# Patient Record
Sex: Female | Born: 1985 | Race: Black or African American | Hispanic: No | Marital: Single | Smoking: Never smoker
Health system: Southern US, Community
[De-identification: ages and names within clinical notes are randomized; demographics above are authoritative.]

## PROBLEM LIST (undated history)

## (undated) DIAGNOSIS — J302 Other seasonal allergic rhinitis: Secondary | ICD-10-CM

---
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
## 2008-01-11 ENCOUNTER — Emergency Department (HOSPITAL_COMMUNITY): Admission: EM | Admit: 2008-01-11 | Discharge: 2008-01-11 | Payer: Self-pay | Admitting: Emergency Medicine

## 2008-09-10 ENCOUNTER — Other Ambulatory Visit: Admission: RE | Admit: 2008-09-10 | Discharge: 2008-09-10 | Payer: Self-pay | Admitting: Obstetrics and Gynecology

## 2009-08-04 ENCOUNTER — Emergency Department (HOSPITAL_COMMUNITY): Admission: EM | Admit: 2009-08-04 | Discharge: 2009-08-04 | Payer: Self-pay | Admitting: Emergency Medicine

## 2009-09-11 ENCOUNTER — Emergency Department (HOSPITAL_COMMUNITY): Admission: EM | Admit: 2009-09-11 | Discharge: 2009-09-11 | Payer: Self-pay | Admitting: Emergency Medicine

## 2010-02-15 ENCOUNTER — Emergency Department (HOSPITAL_COMMUNITY): Admission: EM | Admit: 2010-02-15 | Discharge: 2010-02-15 | Payer: Self-pay | Admitting: Emergency Medicine

## 2010-06-19 ENCOUNTER — Emergency Department (HOSPITAL_COMMUNITY)
Admission: EM | Admit: 2010-06-19 | Discharge: 2010-06-19 | Payer: Self-pay | Source: Home / Self Care | Admitting: Emergency Medicine

## 2011-02-25 ENCOUNTER — Emergency Department (HOSPITAL_COMMUNITY)
Admission: EM | Admit: 2011-02-25 | Discharge: 2011-02-25 | Disposition: A | Discharge status: Home / Self Care | Payer: BC Managed Care – PPO | Attending: Emergency Medicine | Admitting: Emergency Medicine

## 2011-02-25 ENCOUNTER — Emergency Department (HOSPITAL_COMMUNITY): Payer: BC Managed Care – PPO

## 2011-02-25 DIAGNOSIS — M7650 Patellar tendinitis, unspecified knee: Secondary | ICD-10-CM

## 2011-02-25 DIAGNOSIS — M25569 Pain in unspecified knee: Secondary | ICD-10-CM | POA: Insufficient documentation

## 2011-02-25 HISTORY — DX: Other seasonal allergic rhinitis: J30.2

## 2011-02-25 MED ORDER — IBUPROFEN 800 MG PO TABS: 800.0000 mg | ORAL_TABLET | Freq: Three times a day (TID) | ORAL | Status: AC

## 2011-02-25 NOTE — ED Notes (Signed)
Pt requested pregnancy test after triage, told to discuss w. edp and bedside rn

## 2011-02-25 NOTE — ED Provider Notes (Signed)
History     CSN: [id number] Arrival date & time: 02/25/2011  4:29 AM  Chief Complaint  Patient presents with  . Knee Pain   HPI Comments: Patient was playing basketball and she injured her knee. She's been still having trouble whenever she exercises. It feels stiff at times. No known swelling no fevers.  Patient is a 25 y.o. female presenting with knee pain. The history is provided by the patient.  Knee Pain Episode onset: One month ago. The problem occurs daily. The problem has not changed since onset.The symptoms are aggravated by twisting (Exercise). The symptoms are relieved by rest.    Past Medical History  Diagnosis Date  . Seasonal allergies     History reviewed. No pertinent past surgical history.  No family history on file.  History  Substance Use Topics  . Smoking status: Never Smoker   . Smokeless tobacco: Not on file  . Alcohol Use: Yes     occ    OB History    Grav Para Term Preterm Abortions TAB SAB Ect Mult Living                  Review of Systems  All other systems reviewed and are negative.    Physical Exam  BP 126/96  Pulse 57  Temp(Src) 97.8 F (36.6 C) (Oral)  Resp 18  Ht 5\' 5"  (1.651 m)  Wt 149 lb 4 oz (67.699 kg)  BMI 24.84 kg/m2  SpO2 100%  LMP 02/11/2011  Physical Exam  Constitutional: She appears well-developed and well-nourished. No distress.  HENT:  Head: Normocephalic and atraumatic.  Right Ear: External ear normal.  Left Ear: External ear normal.  Eyes: Conjunctivae are normal. Right eye exhibits no discharge. Left eye exhibits no discharge. No scleral icterus.  Neck: Neck supple. No tracheal deviation present.  Pulmonary/Chest: Effort normal. No stridor. No respiratory distress.  Musculoskeletal: Normal range of motion. She exhibits no edema and no tenderness.       Right knee: She exhibits no swelling, no effusion, no deformity and no bony tenderness. no tenderness found. No medial joint line, no lateral joint line, no  MCL and no LCL tenderness noted.  Neurological: She is alert. Cranial nerve deficit: no gross deficits.  Skin: Skin is warm and dry. No rash noted.  Psychiatric: She has a normal mood and affect.    ED Course  Procedures No information on file. Patient requested urine pregnancy tests an x-ray of her knee   Labs Reviewed  PREGNANCY, URINE  POCT PREGNANCY, URINE  Dg Knee Complete 4 Views Right  02/25/2011  *RADIOLOGY REPORT*  Clinical Data: Continued pain after fall 1 month ago.  RIGHT KNEE - COMPLETE 4+ VIEW  Comparison: None.  Findings: No evidence of acute fracture or subluxation.  No focal bone lesions.  Bone matrix and cortex appear intact.  No abnormal radiopaque densities in the soft tissues.  No significant effusion.  IMPRESSION: No acute bony abnormality demonstrated in the right knee.  Original Report Authenticated By: Marlon Pel, M.D.    MDM Patient with mild knee pain with exercise. May be related to tendinitis. We'll have her try a course of NSAIDs followup with orthopedist routinely.     Celene Kras, MD 02/25/11 [phone]

## 2011-02-25 NOTE — ED Notes (Signed)
Pt injured right knee one month ago and wants to get it checked out tonight

## 2012-06-12 ENCOUNTER — Encounter (HOSPITAL_COMMUNITY): Payer: Self-pay | Admitting: *Deleted

## 2012-06-12 ENCOUNTER — Emergency Department (HOSPITAL_COMMUNITY): Payer: Self-pay

## 2012-06-12 ENCOUNTER — Emergency Department (HOSPITAL_COMMUNITY)
Admission: EM | Admit: 2012-06-12 | Discharge: 2012-06-12 | Disposition: A | Discharge status: Home / Self Care | Payer: Self-pay | Attending: Emergency Medicine | Admitting: Emergency Medicine

## 2012-06-12 DIAGNOSIS — R059 Cough, unspecified: Secondary | ICD-10-CM | POA: Insufficient documentation

## 2012-06-12 DIAGNOSIS — R05 Cough: Secondary | ICD-10-CM | POA: Insufficient documentation

## 2012-06-12 DIAGNOSIS — R110 Nausea: Secondary | ICD-10-CM | POA: Insufficient documentation

## 2012-06-12 DIAGNOSIS — R509 Fever, unspecified: Secondary | ICD-10-CM | POA: Insufficient documentation

## 2012-06-12 DIAGNOSIS — R52 Pain, unspecified: Secondary | ICD-10-CM | POA: Insufficient documentation

## 2012-06-12 DIAGNOSIS — R062 Wheezing: Secondary | ICD-10-CM | POA: Insufficient documentation

## 2012-06-12 DIAGNOSIS — R6889 Other general symptoms and signs: Secondary | ICD-10-CM

## 2012-06-12 MED ORDER — ALBUTEROL SULFATE HFA 108 (90 BASE) MCG/ACT IN AERS: 2.0000 | INHALATION_SPRAY | Freq: Once | RESPIRATORY_TRACT | Status: DC

## 2012-06-12 MED ORDER — ALBUTEROL SULFATE HFA 108 (90 BASE) MCG/ACT IN AERS
2.0000 | INHALATION_SPRAY | Freq: Once | RESPIRATORY_TRACT | Status: AC
  Administered 2012-06-12: 2 via RESPIRATORY_TRACT
  Filled 2012-06-12: qty 6.7

## 2012-06-12 MED ORDER — PROMETHAZINE HCL 25 MG PO TABS: 25.0000 mg | ORAL_TABLET | Freq: Four times a day (QID) | ORAL | Status: DC | PRN

## 2012-06-12 MED ORDER — ACETAMINOPHEN 500 MG PO TABS
ORAL_TABLET | ORAL | Status: AC
  Administered 2012-06-12: 1000 mg
  Filled 2012-06-12: qty 2

## 2012-06-12 NOTE — ED Notes (Signed)
Cough, body aches, chills,nausea.  Fever.

## 2012-06-14 NOTE — ED Provider Notes (Signed)
History     CSN: [id number]  Arrival date & time 06/12/12  1621   First MD Initiated Contact with Patient 06/12/12 1835      Chief Complaint  Patient presents with  . Cough    (Consider location/radiation/quality/duration/timing/severity/associated sxs/prior treatment) HPI Comments: Madeline Sanders presents with rather acute onset of non productive cough,  Generalized body aches with chills, fever and vague nausea without emesis, her symptoms started this morning shortly after waking.   She reports wheezing prior to arrival which has since improved.  She does not have asthma.  She denies shortness of breath, chest pain, sore throat, congestion, diarrhea or abdominal pain.  She has taken no medications prior to arrival.  The history is provided by the patient.    Past Medical History  Diagnosis Date  . Seasonal allergies     History reviewed. No pertinent past surgical history.  History reviewed. No pertinent family history.  History  Substance Use Topics  . Smoking status: Never Smoker   . Smokeless tobacco: Not on file  . Alcohol Use: Yes     Comment: occ    OB History    Grav Para Term Preterm Abortions TAB SAB Ect Mult Living                  Review of Systems  Allergies  Erythromycin  Home Medications   Current Outpatient Rx  Name  Route  Sig  Dispense  Refill  . IBUPROFEN 200 MG PO TABS   Oral   Take 800 mg by mouth once as needed. For pain         . ADULT MULTIVITAMIN W/MINERALS CH   Oral   Take 1 tablet by mouth daily.         Marland Kitchen PROMETHAZINE HCL 25 MG PO TABS   Oral   Take 1 tablet (25 mg total) by mouth every 6 (six) hours as needed for nausea.   15 tablet   0     BP 118/77  Pulse 100  Temp 102.3 F (39.1 C) (Oral)  Resp 20  Ht 5\' 5"  (1.651 m)  Wt 148 lb (67.132 kg)  BMI 24.63 kg/m2  SpO2 99%  LMP 06/05/2012  Physical Exam  Constitutional: She is oriented to person, place, and time. She appears well-developed and  well-nourished.       Febrile.  HENT:  Head: Normocephalic and atraumatic.  Right Ear: Tympanic membrane and ear canal normal.  Left Ear: Tympanic membrane and ear canal normal.  Nose: Mucosal edema and rhinorrhea present.  Mouth/Throat: Uvula is midline, oropharynx is clear and moist and mucous membranes are normal. No oropharyngeal exudate, posterior oropharyngeal edema, posterior oropharyngeal erythema or tonsillar abscesses.  Eyes: Conjunctivae normal are normal.  Neck: Neck supple.  Cardiovascular: Normal rate and normal heart sounds.   Pulmonary/Chest: Effort normal. No respiratory distress. She has no wheezes. She has no rales.  Abdominal: Soft. There is no tenderness. There is no guarding.  Musculoskeletal: Normal range of motion.  Lymphadenopathy:    She has no cervical adenopathy.  Neurological: She is alert and oriented to person, place, and time.  Skin: Skin is warm and dry. No rash noted.  Psychiatric: She has a normal mood and affect.    ED Course  Procedures (including critical care time)  Labs Reviewed - No data to display Dg Chest 2 View  06/12/2012  *RADIOLOGY REPORT*  Clinical Data: Cough  CHEST - 2 VIEW  Comparison: None.  Findings: Cardiomediastinal silhouette is unremarkable.  No acute infiltrate or pleural effusion.  No pulmonary edema.  Bony thorax is unremarkable.  IMPRESSION: .  No active disease.   Original Report Authenticated By: Natasha Mead, M.D.      1. Flu-like symptoms    Patient was given tylenol upon arrival.  She reported feeling much better at time of dc.  Temp not rechecked at time of dc.     MDM  Flu like illness.  Pt given albuterol mdi prn cough and wheeze,  Although no active wheeze or respiratory distress during ed visit.  Discussed tamiflu - pt deferred.  Encouraged rest,  Increased fluids.  Tylenol or motrin for fever reduction.  Recheck prn worsened sx  Patients labs and/or radiological studies were reviewed during the medical decision  making and disposition process.         Burgess Amor, PA 06/14/12 1336

## 2012-06-16 NOTE — ED Provider Notes (Signed)
Medical screening examination/treatment/procedure(s) were performed by non-physician practitioner and as supervising physician I was immediately available for consultation/collaboration.  Massai Hankerson, MD 06/16/12 0614 

## 2013-07-02 ENCOUNTER — Other Ambulatory Visit: Payer: Self-pay | Admitting: Obstetrics and Gynecology

## 2013-09-28 ENCOUNTER — Encounter: Payer: Self-pay | Admitting: *Deleted

## 2013-11-26 ENCOUNTER — Encounter: Payer: Self-pay | Admitting: Obstetrics and Gynecology

## 2015-06-16 ENCOUNTER — Emergency Department (HOSPITAL_COMMUNITY)
Admission: EM | Admit: 2015-06-16 | Discharge: 2015-06-16 | Disposition: A | Discharge status: Home / Self Care | Payer: 59 | Attending: Emergency Medicine | Admitting: Emergency Medicine

## 2015-06-16 ENCOUNTER — Encounter (HOSPITAL_COMMUNITY): Payer: Self-pay | Admitting: Emergency Medicine

## 2015-06-16 DIAGNOSIS — Z79899 Other long term (current) drug therapy: Secondary | ICD-10-CM | POA: Insufficient documentation

## 2015-06-16 DIAGNOSIS — J029 Acute pharyngitis, unspecified: Secondary | ICD-10-CM | POA: Diagnosis not present

## 2015-06-16 DIAGNOSIS — J392 Other diseases of pharynx: Secondary | ICD-10-CM

## 2015-06-16 LAB — RAPID STREP SCREEN (MED CTR MEBANE ONLY): Streptococcus, Group A Screen (Direct): NEGATIVE

## 2015-06-16 NOTE — ED Notes (Signed)
Pt states for last 2 weeks been having bad sore throat especially at night

## 2015-06-16 NOTE — Discharge Instructions (Signed)
Stop the benadryl, it will keep your throat dry. Do turn on the vaporizer about an hour before bedtime to get the moisture in the air in your bedroom (keep the door shut). Try to drink fluids prior to bedtime and keep water at your bedside to drink when ever you wake up during the night. Recheck if you get a fever or are unable to swallow or seem worse.

## 2015-06-16 NOTE — ED Provider Notes (Signed)
CSN: [id number]     Arrival date & time 06/16/15  0609 History   First MD Initiated Contact with Patient 06/16/15 [phone]   Chief Complaint  Patient presents with  . Sore Throat     (Consider location/radiation/quality/duration/timing/severity/associated sxs/prior Treatment) HPI patient states about 2 weeks ago she had nasal congestion that was yellow and green. She was using a vicks vaporizer. She states her symptoms are now gone. She states she was taking Benadryl and last took it yesterday. She states about 2 AM in the morning she started noticing she had a very painful sore throat when she swallowed. She states her mouth feels dry. This started about a week ago. She states over the weekend the past 2-3 days it seemed to get better especially yesterday when it was raining. She denies any sneezing, coughing, or fever. She states she's able to eat and drink during the day and her throat doesn't hurt much at all, only at night. 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   PCP Dr Hughie ClossZ Hall  Past Medical History  Diagnosis Date  . Seasonal allergies    History reviewed. No pertinent past surgical history. History reviewed. No pertinent family history. Social History  Substance Use Topics  . Smoking status: Never Smoker   . Smokeless tobacco: None  . 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     Allergies  Erythromycin  Home Medications   Prior to Admission medications   Medication Sig Start Date End Date Taking? Authorizing Provider  ibuprofen (ADVIL,MOTRIN) 200 MG tablet Take 800 mg by mouth once as needed. For pain    Historical Provider, MD  Multiple Vitamin (MULTIVITAMIN WITH MINERALS) TABS Take 1 tablet by mouth daily.    Historical Provider, MD  promethazine  (PHENERGAN) 25 MG tablet Take 1 tablet (25 mg total) by mouth every 6 (six) hours as needed for nausea. 06/12/12   Burgess AmorJulie Idol, PA-C   BP 130/94 mmHg  Pulse 62  Temp(Src) 97.8 F (36.6 C) (Oral)  Resp 18  Ht 5\' 5"  (1.651 m)  Wt 173 lb (78.472 kg)  BMI 28.79 kg/m2  SpO2 100%  LMP 06/12/2015  Vital signs normal   Physical Exam  Constitutional: She is oriented to person, place, and time. She appears well-developed and well-nourished.  Non-toxic appearance. She does not appear ill. No distress.  HENT:  Head: Normocephalic and atraumatic.  Right Ear: Hearing, tympanic membrane, external ear and ear canal normal.  Left Ear: Hearing, tympanic membrane, external ear and ear canal normal.  Nose: Nose normal. No mucosal edema or rhinorrhea.  Mouth/Throat: Oropharynx is clear and moist and mucous membranes are normal. No dental abscesses or uvula swelling.  Eyes: Conjunctivae and EOM are normal. Pupils are equal, round, and reactive to light.  Neck: Normal range of motion and full passive range of motion without pain. Neck supple.  Cardiovascular: Normal rate, regular rhythm and normal heart sounds.  Exam reveals no gallop and no friction rub.   No murmur heard. Pulmonary/Chest: Effort normal and breath sounds normal. No respiratory distress. She has no wheezes. She has no rhonchi. She has no rales. She exhibits no tenderness and no crepitus.  Abdominal: Soft. Normal appearance and  bowel sounds are normal. She exhibits no distension. There is no tenderness. There is no rebound and no guarding.  Musculoskeletal: Normal range of motion. She exhibits no edema or tenderness.  Moves all extremities well.   Neurological: She is alert and oriented to person, place, and time. She has normal strength. No cranial nerve deficit.  Skin: Skin is warm, dry and intact. No rash noted. No erythema. No pallor.  Psychiatric: She has a normal mood and affect. Her speech is normal and behavior is normal. Her mood  appears not anxious.  Nursing note and vitals reviewed.   ED Course  Procedures (including critical care time)  I discussed with patient that it sounds like her throat is just getting very dry while she is sleeping at night. She has been taking Benadryl for her nasal congestion which is very drying. Also at night most people have breathing with her mouth open which again dries out the throat. Her throat looks normal however due to her exposure to children a strep screen was done.  We discussed that if strep screen was negative she should drink water before she goes to bed and keep water at her bedside to drink whenever she wakes up during the night. She can use the vaporizer in her room and started about an hour prior to bedroom and shut the door so the moisture stays in her room. And now that her nasal congestion is gone she can stop the Benadryl.  Labs Review Strept pending   MDM   Final diagnoses:  Sore throat  Dry throat    Plan discharge  Devoria Albe, MD, Concha Pyo, MD 06/16/15 [phone]

## 2015-06-18 LAB — CULTURE, GROUP A STREP: STREP A CULTURE: NEGATIVE

## 2015-07-13 ENCOUNTER — Encounter (HOSPITAL_COMMUNITY): Payer: Self-pay | Admitting: Emergency Medicine

## 2015-07-13 ENCOUNTER — Emergency Department (HOSPITAL_COMMUNITY)
Admission: EM | Admit: 2015-07-13 | Discharge: 2015-07-13 | Disposition: A | Discharge status: Home / Self Care | Payer: BLUE CROSS/BLUE SHIELD | Attending: Emergency Medicine | Admitting: Emergency Medicine

## 2015-07-13 DIAGNOSIS — R0981 Nasal congestion: Secondary | ICD-10-CM | POA: Diagnosis present

## 2015-07-13 DIAGNOSIS — J0100 Acute maxillary sinusitis, unspecified: Secondary | ICD-10-CM | POA: Insufficient documentation

## 2015-07-13 MED ORDER — AMOXICILLIN-POT CLAVULANATE 875-125 MG PO TABS: 1.0000 | ORAL_TABLET | Freq: Two times a day (BID) | ORAL | Status: DC

## 2015-07-13 NOTE — ED Provider Notes (Signed)
CSN: [id number]     Arrival date & time 07/13/15  1020 History   First MD Initiated Contact with Patient 07/13/15 1052     Chief Complaint  Patient presents with  . Nasal Congestion     (Consider location/radiation/quality/duration/timing/severity/associated sxs/prior Treatment) Patient is a 30 y.o. female presenting with cough. The history is provided by the patient. No language interpreter was used.  Cough Cough characteristics:  Productive Sputum characteristics:  Nondescript Severity:  Moderate Onset quality:  Gradual Duration:  4 weeks Timing:  Constant Progression:  Worsening Chronicity:  New Smoker: no   Context: upper respiratory infection   Relieved by:  Nothing Worsened by:  Nothing tried Ineffective treatments:  None tried Associated symptoms: sinus congestion   Pt has tried zyrtec, claritin, flonase and a humidifier.  Pt reports no relief.  Pt complains of green sinus congestion  Past Medical History  Diagnosis Date  . Seasonal allergies    History reviewed. No pertinent past surgical history. No family history on file. Social History  Substance Use Topics  . Smoking status: Never Smoker   . Smokeless tobacco: None  . Alcohol Use: Yes     Comment: occ   OB History    No data available     Review of Systems  Respiratory: Positive for cough.   All other systems reviewed and are negative.     Allergies  Erythromycin  Home Medications   Prior to Admission medications   Medication Sig Start Date End Date Taking? Authorizing Provider  amoxicillin-clavulanate (AUGMENTIN) 875-125 MG tablet Take 1 tablet by mouth every 12 (twelve) hours. 07/13/15   Elson AreasLeslie K Sofia, PA-C  diphenhydrAMINE (BENADRYL) 25 mg capsule Take 25 mg by mouth every 6 (six) hours as needed for allergies.    Historical Provider, MD  Pseudoeph-Doxylamine-DM-APAP (NYQUIL PO) Take 2 capsules by mouth daily as needed (cold symptoms).    Historical Provider, MD   BP 130/90 mmHg  Pulse 76   Temp(Src) 97.9 F (36.6 C) (Oral)  Resp 16  Ht 5\' 5"  (1.651 m)  Wt 78.472 kg  BMI 28.79 kg/m2  SpO2 100%  LMP 07/11/2015 Physical Exam  Constitutional: She is oriented to person, place, and time. She appears well-developed and well-nourished.  HENT:  Head: Normocephalic and atraumatic.  Right Ear: External ear normal.  Left Ear: External ear normal.  Tender bilat maxillary sinuses  Eyes: Conjunctivae and EOM are normal. Pupils are equal, round, and reactive to light.  Neck: Normal range of motion.  Cardiovascular: Normal rate.   Pulmonary/Chest: Effort normal.  Abdominal: She exhibits no distension.  Musculoskeletal: Normal range of motion.  Neurological: She is alert and oriented to person, place, and time.  Psychiatric: She has a normal mood and affect.  Nursing note and vitals reviewed.   ED Course  Procedures (including critical care time) Labs Review Labs Reviewed - No data to display  Imaging Review No results found. I have personally reviewed and evaluated these images and lab results as part of my medical decision-making.   EKG Interpretation None      MDM   Final diagnoses:  Acute maxillary sinusitis, recurrence not specified    Meds ordered this encounter  Medications  . amoxicillin-clavulanate (AUGMENTIN) 875-125 MG tablet    Sig: Take 1 tablet by mouth every 12 (twelve) hours.    Dispense:  14 tablet    Refill:  0    Order Specific Question:  Supervising Provider    Answer:  Eber HongMILLER, BRIAN [[id number]]  See Dr. Margo Aye for recheck if symptoms persist   Elson Areas, PA-C 07/13/15 1105  Donnetta Hutching, MD 07/14/15 [phone]

## 2015-07-13 NOTE — Discharge Instructions (Signed)

## 2015-07-13 NOTE — ED Notes (Signed)
Pt reports nasal/chest congestion and cough x 1 month. Pt states she has been taking OTC medications with no relief. Reports coughing up thick, yellow sputum.

## 2015-10-29 ENCOUNTER — Ambulatory Visit: Payer: BLUE CROSS/BLUE SHIELD | Admitting: Nutrition

## 2015-11-12 ENCOUNTER — Telehealth: Payer: Self-pay | Admitting: Nutrition

## 2015-11-12 ENCOUNTER — Ambulatory Visit: Payer: BLUE CROSS/BLUE SHIELD | Admitting: Nutrition

## 2015-11-12 NOTE — Telephone Encounter (Signed)
vm to reschedule missed appt.

## 2018-06-14 ENCOUNTER — Encounter (HOSPITAL_COMMUNITY): Payer: Self-pay | Admitting: Emergency Medicine

## 2018-06-14 ENCOUNTER — Other Ambulatory Visit: Payer: Self-pay

## 2018-06-14 ENCOUNTER — Emergency Department (HOSPITAL_COMMUNITY): Payer: Self-pay

## 2018-06-14 ENCOUNTER — Emergency Department (HOSPITAL_COMMUNITY)
Admission: EM | Admit: 2018-06-14 | Discharge: 2018-06-14 | Disposition: A | Discharge status: Home / Self Care | Payer: Self-pay | Attending: Emergency Medicine | Admitting: Emergency Medicine

## 2018-06-14 DIAGNOSIS — R0600 Dyspnea, unspecified: Secondary | ICD-10-CM | POA: Insufficient documentation

## 2018-06-14 MED ORDER — ALBUTEROL SULFATE (2.5 MG/3ML) 0.083% IN NEBU: 5.0000 mg | INHALATION_SOLUTION | Freq: Once | RESPIRATORY_TRACT | Status: DC

## 2018-06-14 NOTE — ED Triage Notes (Signed)
Patient states she has felt SOB for approx 1 week, states she feels like it may be related to overeating. RR 16 in Triage, O2 100%. Patient thinks it may be more anxiety than SOB. NAD noted in Triage.

## 2018-06-14 NOTE — ED Notes (Signed)
Pt ambulatory to waiting room. Pt verbalized understanding of discharge instructions.   

## 2018-06-14 NOTE — Progress Notes (Signed)
Pt assessed bs clear hr 76 rr 16 spo2 100% room air  No treatment needed pt in no distressed. treatment d/c'd

## 2018-06-14 NOTE — ED Provider Notes (Signed)
[facility] Provider Note   CSN: [id number] Arrival date & time: 06/14/18  1830     History   Chief Complaint Chief Complaint  Patient presents with  . Shortness of Breath    HPI Marylynn PearsonCheyenne C Hemme is a 32 y.o. female.  Patient states that she get short of breath because she ate too fast.  Then she get anxious that she was short of breath he got more short of breath and a little sweaty.  This is all resolved multiple hours prior to my evaluation will wait in the waiting room.  At this time she is asymptomatic.  She has no lower extremity swelling.  No recent fevers or cough.  She has a history of very similar episodes.  States she is been under a lot of stress recently and never really had any anxiety attacks that she knows of.  No other associated symptoms.   Shortness of Breath     Past Medical History:  Diagnosis Date  . Seasonal allergies     There are no active problems to display for this patient.   History reviewed. No pertinent surgical history.   OB History   None      Home Medications    Prior to Admission medications   Medication Sig Start Date End Date Taking? Authorizing Provider  amoxicillin-clavulanate (AUGMENTIN) 875-125 MG tablet Take 1 tablet by mouth every 12 (twelve) hours. 07/13/15   Elson AreasSofia, Leslie K, PA-C  diphenhydrAMINE (BENADRYL) 25 mg capsule Take 25 mg by mouth every 6 (six) hours as needed for allergies.    [provider]  Pseudoeph-Doxylamine-DM-APAP (NYQUIL PO) Take 2 capsules by mouth daily as needed (cold symptoms).    [provider]    Family History Family History  Problem Relation Age of Onset  . Diabetes Mother     Social History Social History   Tobacco Use  . Smoking status: Never Smoker  . Smokeless tobacco: Never Used  Substance Use Topics  . Alcohol use: Yes    Comment: occ  . Drug use: Never     Allergies   Erythromycin   Review of Systems Review of Systems    Respiratory: Positive for shortness of breath.   All other systems reviewed and are negative.    Physical Exam Updated Vital Signs BP (!) 153/85 (BP Location: Right Arm)   Pulse 74   Temp 98 F (36.7 C) (Oral)   Resp 16   Ht 5\' 5"  (1.651 m)   Wt 74.8 kg   LMP 06/11/2018   SpO2 100%   BMI 27.46 kg/m   Physical Exam  Constitutional: She appears well-developed and well-nourished.  HENT:  Head: Normocephalic and atraumatic.  Neck: Normal range of motion.  Cardiovascular: Normal rate and regular rhythm.  Pulmonary/Chest: Breath sounds normal. No stridor. No respiratory distress. She exhibits no mass and no tenderness.  Abdominal: She exhibits no distension.  Musculoskeletal: Normal range of motion.       Right lower leg: Normal.       Left lower leg: Normal.  Neurological: She is alert.  Skin: Skin is warm and dry.  Nursing note and vitals reviewed.    ED Treatments / Results  Labs (all labs ordered are listed, but only abnormal results are displayed) Labs Reviewed - No data to display  EKG EKG Interpretation  Date/Time:  Wednesday June 14 2018 18:45:50 EST Ventricular Rate:  75 PR Interval:  122 QRS Duration: 88 QT Interval:  342  QTC Calculation: 381 R Axis:   62 Text Interpretation:  Normal sinus rhythm with sinus arrhythmia No old tracing to compare Confirmed by Marily Memos [phone]) on 06/14/2018 11:56:25 PM   Radiology Dg Chest 2 View  Result Date: 06/14/2018 CLINICAL DATA:  Shortness of breath EXAM: CHEST - 2 VIEW COMPARISON:  06/12/2012 FINDINGS: Heart and mediastinal contours are within normal limits. No focal opacities or effusions. No acute bony abnormality. IMPRESSION: No active cardiopulmonary disease. Electronically Signed   By: Charlett Nose M.D.   On: 06/14/2018 19:25    Procedures Procedures (including critical care time)  Medications Ordered in ED Medications - No data to display   Initial Impression / Assessment and Plan / ED Course   I have reviewed the triage vital signs and the nursing notes.  Pertinent labs & imaging results that were available during my care of the patient were reviewed by me and considered in my medical decision making (see chart for details).     Suspect benign cause of symptoms exacerbated by some anxiety. Normal VS. Normal XR. Normal ECG. Doubt acs/pe/dissection or other emergent causes for symptoms at this time.   Final Clinical Impressions(s) / ED Diagnoses   Final diagnoses:  Dyspnea, unspecified type    ED Discharge Orders    None       Rozalia Dino, Barbara Cower, MD 06/14/18 2356

## 2018-08-16 ENCOUNTER — Emergency Department (HOSPITAL_COMMUNITY): Payer: Self-pay

## 2018-08-16 ENCOUNTER — Emergency Department (HOSPITAL_COMMUNITY)
Admission: EM | Admit: 2018-08-16 | Discharge: 2018-08-16 | Disposition: A | Discharge status: Home / Self Care | Payer: Self-pay | Attending: Emergency Medicine | Admitting: Emergency Medicine

## 2018-08-16 ENCOUNTER — Other Ambulatory Visit: Payer: Self-pay

## 2018-08-16 ENCOUNTER — Encounter (HOSPITAL_COMMUNITY): Payer: Self-pay

## 2018-08-16 DIAGNOSIS — R251 Tremor, unspecified: Secondary | ICD-10-CM | POA: Insufficient documentation

## 2018-08-16 DIAGNOSIS — R079 Chest pain, unspecified: Secondary | ICD-10-CM | POA: Insufficient documentation

## 2018-08-16 LAB — URINALYSIS, ROUTINE W REFLEX MICROSCOPIC
Bilirubin Urine: NEGATIVE
GLUCOSE, UA: NEGATIVE mg/dL
Hgb urine dipstick: NEGATIVE
Ketones, ur: NEGATIVE mg/dL
LEUKOCYTES UA: NEGATIVE
Nitrite: NEGATIVE
PROTEIN: NEGATIVE mg/dL
Specific Gravity, Urine: 1.005 (ref 1.005–1.030)
pH: 8 (ref 5.0–8.0)

## 2018-08-16 LAB — CBG MONITORING, ED: GLUCOSE-CAPILLARY: 73 mg/dL (ref 70–99)

## 2018-08-16 LAB — CBC WITH DIFFERENTIAL/PLATELET
ABS IMMATURE GRANULOCYTES: 0.01 10*3/uL (ref 0.00–0.07)
BASOS PCT: 1 %
Basophils Absolute: 0 10*3/uL (ref 0.0–0.1)
Eosinophils Absolute: 0.1 10*3/uL (ref 0.0–0.5)
Eosinophils Relative: 1 %
HEMATOCRIT: 44.7 % (ref 36.0–46.0)
HEMOGLOBIN: 14.1 g/dL (ref 12.0–15.0)
Immature Granulocytes: 0 %
LYMPHS PCT: 37 %
Lymphs Abs: 1.6 10*3/uL (ref 0.7–4.0)
MCH: 30 pg (ref 26.0–34.0)
MCHC: 31.5 g/dL (ref 30.0–36.0)
MCV: 95.1 fL (ref 80.0–100.0)
MONOS PCT: 10 %
Monocytes Absolute: 0.4 10*3/uL (ref 0.1–1.0)
NEUTROS ABS: 2.1 10*3/uL (ref 1.7–7.7)
Neutrophils Relative %: 51 %
Platelets: 191 10*3/uL (ref 150–400)
RBC: 4.7 MIL/uL (ref 3.87–5.11)
RDW: 12.7 % (ref 11.5–15.5)
WBC: 4.2 10*3/uL (ref 4.0–10.5)
nRBC: 0 % (ref 0.0–0.2)

## 2018-08-16 LAB — TROPONIN I

## 2018-08-16 LAB — COMPREHENSIVE METABOLIC PANEL
ALT: 16 U/L (ref 0–44)
AST: 21 U/L (ref 15–41)
Albumin: 4.4 g/dL (ref 3.5–5.0)
Alkaline Phosphatase: 43 U/L (ref 38–126)
Anion gap: 10 (ref 5–15)
BUN: 14 mg/dL (ref 6–20)
CHLORIDE: 103 mmol/L (ref 98–111)
CO2: 26 mmol/L (ref 22–32)
Calcium: 9.8 mg/dL (ref 8.9–10.3)
Creatinine, Ser: 0.78 mg/dL (ref 0.44–1.00)
GFR calc Af Amer: >60 mL/min (ref >60)
Glucose, Bld: 74 mg/dL (ref 70–99)
Potassium: 4.5 mmol/L (ref 3.5–5.1)
Sodium: 139 mmol/L (ref 135–145)
Total Bilirubin: 0.7 mg/dL (ref 0.3–1.2)
Total Protein: 7.8 g/dL (ref 6.5–8.1)

## 2018-08-16 LAB — PREGNANCY, URINE: Preg Test, Ur: NEGATIVE

## 2018-08-16 NOTE — ED Notes (Signed)
Patient states she refuses xray. She states she is feeling much better and would like to leave.

## 2018-08-16 NOTE — Discharge Instructions (Addendum)
Take your usual prescriptions as previously directed.  Call your regular medical doctor today to schedule a follow up appointment within the next 3 days.  Return to the Emergency Department immediately sooner if worsening.  ° °

## 2018-08-16 NOTE — ED Triage Notes (Signed)
Pt reports intermittent chest tightness and feeling shaky for the past week.  Denies chest pain or tightness at this time.

## 2018-08-16 NOTE — ED Provider Notes (Signed)
[facility] Provider Note   CSN: [id number] Arrival date & time: 08/16/18  [id number]     History   Chief Complaint Chief Complaint  Patient presents with  . Chest Pain    HPI Madeline Sanders is a 33 y.o. female.  HPI Pt was seen at 1010. Per pt, c/o gradual onset and persistence of multiple intermittent episodes of "shakiness" that have occurred for the past 1 week. Pt states she feels "shakey all over" and has generalized chest "tightness." States these symptoms occur for a short while "after I eat carbs or chocolate or fast food, but not when I eat vegetables." Denies palpitations, no SOB/cough, no abd pain, no N/V/D, no back pain, no rash, no fevers, no focal motor weakness.   Past Medical History:  Diagnosis Date  . Seasonal allergies     There are no active problems to display for this patient.   History reviewed. No pertinent surgical history.   OB History   No obstetric history on file.      Home Medications    Prior to Admission medications   Medication Sig Start Date End Date Taking? Authorizing Provider  amoxicillin-clavulanate (AUGMENTIN) 875-125 MG tablet Take 1 tablet by mouth every 12 (twelve) hours. 07/13/15   Elson AreasSofia, Leslie K, PA-C  diphenhydrAMINE (BENADRYL) 25 mg capsule Take 25 mg by mouth every 6 (six) hours as needed for allergies.    [provider]  Pseudoeph-Doxylamine-DM-APAP (NYQUIL PO) Take 2 capsules by mouth daily as needed (cold symptoms).    [provider]    Family History Family History  Problem Relation Age of Onset  . Diabetes Mother     Social History Social History   Tobacco Use  . Smoking status: Never Smoker  . Smokeless tobacco: Never Used  Substance Use Topics  . Alcohol use: Yes    Comment: occ  . Drug use: Never     Allergies   Erythromycin   Review of Systems Review of Systems ROS: Statement: All systems negative except as marked or noted in the HPI; Constitutional:  Negative for fever and chills. +"shakiness and chest tightness after eating."; ; Eyes: Negative for eye pain, redness and discharge. ; ; ENMT: Negative for ear pain, hoarseness, nasal congestion, sinus pressure and sore throat. ; ; Cardiovascular: Negative for chest pain, palpitations, diaphoresis, dyspnea and peripheral edema. ; ; Respiratory: Negative for cough, wheezing and stridor. ; ; Gastrointestinal: Negative for nausea, vomiting, diarrhea, abdominal pain, blood in stool, hematemesis, jaundice and rectal bleeding. . ; ; Genitourinary: Negative for dysuria, flank pain and hematuria. ; ; Musculoskeletal: Negative for back pain and neck pain. Negative for swelling and trauma.; ; Skin: Negative for pruritus, rash, abrasions, blisters, bruising and skin lesion.; ; Neuro: Negative for headache, lightheadedness and neck stiffness. Negative for weakness, altered level of consciousness, altered mental status, extremity weakness, paresthesias, involuntary movement, seizure and syncope.       Physical Exam Updated Vital Signs Ht 5\' 5"  (1.651 m)   Wt 77.1 kg   LMP 08/02/2018   BMI 28.29 kg/m   Physical Exam 1015: Physical examination:  Nursing notes reviewed; Vital signs and O2 SAT reviewed;  Constitutional: Well developed, Well nourished, Well hydrated, In no acute distress; Head:  Normocephalic, atraumatic; Eyes: EOMI, PERRL, No scleral icterus; ENMT: Mouth and pharynx normal, Mucous membranes moist; Neck: Supple, Full range of motion, No lymphadenopathy; Cardiovascular: Regular rate and rhythm, No gallop; Respiratory: Breath sounds clear & equal bilaterally, No wheezes.  Speaking full sentences with ease, Normal respiratory effort/excursion; Chest: Nontender, Movement normal; Abdomen: Soft, Nontender, Nondistended, Normal bowel sounds; Genitourinary: No CVA tenderness; Extremities: Peripheral pulses normal, No tenderness, No edema, No calf edema or asymmetry.; Neuro: AA&Ox3, Major CN grossly intact.   Speech clear. No gross focal motor or sensory deficits in extremities.; Skin: Color normal, Warm, Dry.; Psych:  Anxious.     ED Treatments / Results  Labs (all labs ordered are listed, but only abnormal results are displayed)   EKG EKG Interpretation  Date/Time:  Wednesday August 16 2018 10:02:37 EST Ventricular Rate:  68 PR Interval:    QRS Duration: 76 QT Interval:  355 QTC Calculation: 378 R Axis:   55 Text Interpretation:  Sinus rhythm Nonspecific repol abnormality, inferior leads Borderline ST elevation, lateral leads Baseline wander When compared with ECG of 06/14/2018 No significant change was found Confirmed by Samuel Jester [phone]) on 08/16/2018 10:18:59 AM   Radiology   Procedures Procedures (including critical care time)  Medications Ordered in ED Medications - No data to display   Initial Impression / Assessment and Plan / ED Course  I have reviewed the triage vital signs and the nursing notes.  Pertinent labs & imaging results that were available during my care of the patient were reviewed by me and considered in my medical decision making (see chart for details).  MDM Reviewed: previous chart, nursing note and vitals Reviewed previous: labs and ECG Interpretation: labs and ECG   Results for orders placed or performed during the hospital encounter of 08/16/18  Comprehensive metabolic panel  Result Value Ref Range   Sodium 139 135 - 145 mmol/L   Potassium 4.5 3.5 - 5.1 mmol/L   Chloride 103 98 - 111 mmol/L   CO2 26 22 - 32 mmol/L   Glucose, Bld 74 70 - 99 mg/dL   BUN 14 6 - 20 mg/dL   Creatinine, Ser 6.04 0.44 - 1.00 mg/dL   Calcium 9.8 8.9 - 54.0 mg/dL   Total Protein 7.8 6.5 - 8.1 g/dL   Albumin 4.4 3.5 - 5.0 g/dL   AST 21 15 - 41 U/L   ALT 16 0 - 44 U/L   Alkaline Phosphatase 43 38 - 126 U/L   Total Bilirubin 0.7 0.3 - 1.2 mg/dL   GFR calc non Af Amer >60 >60 mL/min   GFR calc Af Amer >60 >60 mL/min   Anion gap 10 5 - 15  Troponin I -  Once  Result Value Ref Range   Troponin I <0.03 <0.03 ng/mL  CBC with Differential  Result Value Ref Range   WBC 4.2 4.0 - 10.5 K/uL   RBC 4.70 3.87 - 5.11 MIL/uL   Hemoglobin 14.1 12.0 - 15.0 g/dL   HCT 98.1 19.1 - 47.8 %   MCV 95.1 80.0 - 100.0 fL   MCH 30.0 26.0 - 34.0 pg   MCHC 31.5 30.0 - 36.0 g/dL   RDW 29.5 62.1 - 30.8 %   Platelets 191 150 - 400 K/uL   nRBC 0.0 0.0 - 0.2 %   Neutrophils Relative % 51 %   Neutro Abs 2.1 1.7 - 7.7 K/uL   Lymphocytes Relative 37 %   Lymphs Abs 1.6 0.7 - 4.0 K/uL   Monocytes Relative 10 %   Monocytes Absolute 0.4 0.1 - 1.0 K/uL   Eosinophils Relative 1 %   Eosinophils Absolute 0.1 0.0 - 0.5 K/uL   Basophils Relative 1 %   Basophils Absolute 0.0 0.0 -  0.1 K/uL   Immature Granulocytes 0 %   Abs Immature Granulocytes 0.01 0.00 - 0.07 K/uL  Pregnancy, urine  Result Value Ref Range   Preg Test, Ur NEGATIVE NEGATIVE  Urinalysis, Routine w reflex microscopic  Result Value Ref Range   Color, Urine YELLOW YELLOW   APPearance CLEAR CLEAR   Specific Gravity, Urine 1.005 1.005 - 1.030   pH 8.0 5.0 - 8.0   Glucose, UA NEGATIVE NEGATIVE mg/dL   Hgb urine dipstick NEGATIVE NEGATIVE   Bilirubin Urine NEGATIVE NEGATIVE   Ketones, ur NEGATIVE NEGATIVE mg/dL   Protein, ur NEGATIVE NEGATIVE mg/dL   Nitrite NEGATIVE NEGATIVE   Leukocytes, UA NEGATIVE NEGATIVE  POC CBG, ED  Result Value Ref Range   Glucose-Capillary 73 70 - 99 mg/dL    6269:  Pt does not want the CXR completed and just wants to leave now. States she "feels fine." Pt encouraged to f/u with PMD for good continuity of care. Doubt PE as cause for symptoms with low risk Wells.  Doubt ACS as cause for symptoms with normal troponin after 1 week of atypical symptoms.  Dx and testing d/w pt.  Questions answered.  Verb understanding, agreeable to d/c home with outpt f/u.     Final Clinical Impressions(s) / ED Diagnoses   Final diagnoses:  None    ED Discharge Orders    None         Samuel Jester, DO 08/19/18 1532

## 2018-12-21 ENCOUNTER — Emergency Department (HOSPITAL_COMMUNITY): Payer: Self-pay

## 2018-12-21 ENCOUNTER — Encounter (HOSPITAL_COMMUNITY): Payer: Self-pay | Admitting: *Deleted

## 2018-12-21 ENCOUNTER — Emergency Department (HOSPITAL_COMMUNITY)
Admission: EM | Admit: 2018-12-21 | Discharge: 2018-12-22 | Disposition: A | Discharge status: Home / Self Care | Payer: Self-pay | Attending: Emergency Medicine | Admitting: Emergency Medicine

## 2018-12-21 ENCOUNTER — Other Ambulatory Visit: Payer: Self-pay

## 2018-12-21 DIAGNOSIS — Z79899 Other long term (current) drug therapy: Secondary | ICD-10-CM | POA: Insufficient documentation

## 2018-12-21 DIAGNOSIS — R251 Tremor, unspecified: Secondary | ICD-10-CM | POA: Insufficient documentation

## 2018-12-21 NOTE — ED Provider Notes (Signed)
[facility] Provider [facility]   CSN: [id number] Arrival date & time: 12/21/18  2251     History   Chief Complaint Chief Complaint  Patient presents with  . Tremors    HPI Madeline Sanders is a 33 y.o. female.     Patient is a 33 year old female with no significant past medical history.  She presents today for evaluation of shaking.  Patient was watching a movie this evening, fell asleep, then woke up with shaking of her arms and legs and throughout her entire body.  She states that she had uncontrollable shaking that lasted for approximately 15 minutes.  She did not lose consciousness and did not lose bowel or bladder control.  Patient states that this is never happened to her before.  This episode resolved spontaneously about 15 minutes after beginning.  She denies any aggravating or alleviating factors.  She denies any other recent symptoms.  The history is provided by the patient.    Past Medical History:  Diagnosis Date  . Seasonal allergies     There are no active problems to display for this patient.   History reviewed. No pertinent surgical history.   OB History   No obstetric history on file.      Home Medications    Prior to Admission medications   Medication Sig Start Date End Date Taking? Authorizing Provider  oxymetazoline (MUCINEX NASAL SPRAY FULL FORCE) 0.05 % nasal spray Place 1 spray into both nostrils every evening.    [provider]    Family History Family History  Problem Relation Age of Onset  . Diabetes Mother     Social History Social History   Tobacco Use  . Smoking status: Never Smoker  . Smokeless tobacco: Never Used  Substance Use Topics  . Alcohol use: Yes    Comment: occ  . Drug use: Never     Allergies   Erythromycin   Review of Systems Review of Systems  All other systems reviewed and are negative.    Physical Exam Updated Vital Signs BP 135/87 (BP Location: Right Arm)   Pulse 74    Temp 97.7 F (36.5 C) (Oral)   Resp 16   Ht 5\' 5"  (1.651 m)   LMP 12/01/2018   SpO2 100%   BMI 28.29 kg/m   Physical Exam Vitals signs and nursing note reviewed.  Constitutional:      General: She is not in acute distress.    Appearance: She is well-developed. She is not diaphoretic.  HENT:     Head: Normocephalic and atraumatic.  Eyes:     Extraocular Movements: Extraocular movements intact.     Pupils: Pupils are equal, round, and reactive to light.  Neck:     Musculoskeletal: Normal range of motion and neck supple.  Cardiovascular:     Rate and Rhythm: Normal rate and regular rhythm.     Heart sounds: No murmur. No friction rub. No gallop.   Pulmonary:     Effort: Pulmonary effort is normal. No respiratory distress.     Breath sounds: Normal breath sounds. No wheezing.  Abdominal:     General: Bowel sounds are normal. There is no distension.     Palpations: Abdomen is soft.     Tenderness: There is no abdominal tenderness.  Musculoskeletal: Normal range of motion.  Skin:    General: Skin is warm and dry.  Neurological:     General: No focal deficit present.     Mental  Status: She is alert and oriented to person, place, and time.     Cranial Nerves: No cranial nerve deficit.     Motor: No weakness.     Coordination: Coordination normal.      ED Treatments / Results  Labs (all labs ordered are listed, but only abnormal results are displayed) Labs Reviewed - No data to display  EKG    Radiology No results found.  Procedures Procedures (including critical care time)  Medications Ordered in ED Medications - No data to display   Initial Impression / Assessment and Plan / ED Course  I have reviewed the triage vital signs and the nursing notes.  Pertinent labs & imaging results that were available during my care of the patient were reviewed by me and considered in my medical decision making (see chart for details).  Patient presents here with complaints  of a shaking episode as described in the HPI.  The etiology of this episode I am uncertain.  By the time the patient got here it had resolved.  She is neurologically intact, vitals are stable, laboratory studies are normal, and CT scan of the head is negative.  At this point, I see no indication for further work-up.  She remained symptom-free.  She will follow-up with primary doctor if she experiences additional episodes.  This does not appear to be a seizure based on what she is describing.  Final Clinical Impressions(s) / ED Diagnoses   Final diagnoses:  None    ED Discharge Orders    None       Veryl Speak, MD 12/22/18 [phone]

## 2018-12-21 NOTE — ED Triage Notes (Signed)
Pt with uncontrolled shaking starting today, woke up from sleep with shaking.  Pt denies any hx.  Denies DM.

## 2018-12-22 LAB — CBC WITH DIFFERENTIAL/PLATELET
Abs Immature Granulocytes: 0.01 10*3/uL (ref 0.00–0.07)
Basophils Absolute: 0 10*3/uL (ref 0.0–0.1)
Basophils Relative: 1 %
Eosinophils Absolute: 0.1 10*3/uL (ref 0.0–0.5)
Eosinophils Relative: 2 %
HCT: 40.2 % (ref 36.0–46.0)
Hemoglobin: 13 g/dL (ref 12.0–15.0)
Immature Granulocytes: 0 %
Lymphocytes Relative: 40 %
Lymphs Abs: 2.3 10*3/uL (ref 0.7–4.0)
MCH: 30 pg (ref 26.0–34.0)
MCHC: 32.3 g/dL (ref 30.0–36.0)
MCV: 92.8 fL (ref 80.0–100.0)
Monocytes Absolute: 0.6 10*3/uL (ref 0.1–1.0)
Monocytes Relative: 10 %
Neutro Abs: 2.7 10*3/uL (ref 1.7–7.7)
Neutrophils Relative %: 47 %
Platelets: 153 10*3/uL (ref 150–400)
RBC: 4.33 MIL/uL (ref 3.87–5.11)
RDW: 12.6 % (ref 11.5–15.5)
WBC: 5.8 10*3/uL (ref 4.0–10.5)
nRBC: 0 % (ref 0.0–0.2)

## 2018-12-22 LAB — BASIC METABOLIC PANEL
Anion gap: 9 (ref 5–15)
BUN: 15 mg/dL (ref 6–20)
CO2: 24 mmol/L (ref 22–32)
Calcium: 9 mg/dL (ref 8.9–10.3)
Chloride: 107 mmol/L (ref 98–111)
Creatinine, Ser: 0.84 mg/dL (ref 0.44–1.00)
GFR calc Af Amer: >60 mL/min (ref >60)
GFR calc non Af Amer: >60 mL/min (ref >60)
Glucose, Bld: 97 mg/dL (ref 70–99)
Potassium: 4.5 mmol/L (ref 3.5–5.1)
Sodium: 140 mmol/L (ref 135–145)

## 2018-12-22 LAB — HCG, SERUM, QUALITATIVE: Preg, Serum: NEGATIVE

## 2018-12-22 NOTE — Discharge Instructions (Addendum)
Return to the emergency department if you experience any new and/or concerning symptoms. 

## 2018-12-22 NOTE — ED Notes (Signed)
Pt ambulatory to waiting room. Pt verbalized understanding of discharge instructions.   

## 2018-12-22 NOTE — ED Notes (Signed)
Patient transported to CT 

## 2019-01-12 ENCOUNTER — Encounter (HOSPITAL_COMMUNITY): Payer: Self-pay

## 2019-01-12 ENCOUNTER — Other Ambulatory Visit: Payer: Self-pay

## 2019-01-12 ENCOUNTER — Emergency Department (HOSPITAL_COMMUNITY)
Admission: EM | Admit: 2019-01-12 | Discharge: 2019-01-13 | Disposition: A | Discharge status: Home / Self Care | Payer: Self-pay | Attending: Emergency Medicine | Admitting: Emergency Medicine

## 2019-01-12 DIAGNOSIS — R202 Paresthesia of skin: Secondary | ICD-10-CM | POA: Insufficient documentation

## 2019-01-12 DIAGNOSIS — Z79899 Other long term (current) drug therapy: Secondary | ICD-10-CM | POA: Insufficient documentation

## 2019-01-12 NOTE — ED Triage Notes (Signed)
Pt states she has been having some tingling and muscles "jumping" in her right arm since yesterday.  Pt describes "pins and needles"  Pt denies weakness. Pt states she was here in June for same.

## 2019-01-13 ENCOUNTER — Emergency Department (HOSPITAL_COMMUNITY): Payer: Self-pay

## 2019-01-13 NOTE — Discharge Instructions (Addendum)
I have put in an order for an MRI of your brain to look further to evaluate your symptoms.  Please follow-up with Dr. Nevada Crane after your MRI to get the results.  Also you can follow-up with him if you have persistent symptoms.  Otherwise I would continue to avoid the foods that you feel like make you feel worse.  Return to the emergency department if you get weakness in an arm or leg, or your face, you have difficulty speaking, using your arm or your leg.

## 2019-01-13 NOTE — ED Provider Notes (Signed)
[facility] EMERGENCY DEPARTMENT Provider Note   CSN: [id number] Arrival date & time: 01/12/19  2329  Time seen 12:30 AM  History   Chief Complaint Chief Complaint  Patient presents with  . Numbness    pins and needles feeling in arm    HPI Madeline Sanders is a 33 y.o. female.     HPI  Patient states July 2 about 5:30 in the afternoon after eating at [facility] and a piece of cake she started having bilateral "upper arm jumping".  She does states it is a tingling sensation.  It runs from her shoulders to her elbows.  She had it on her left upper arm all evening but when she woke up this morning it was only on the right side.  She states it is better now than it was.  She denies any numbness or weakness in her extremities.  She is right-handed.  She denies any neck pain.  She states there is a family history of diabetes but she denies polydipsia or polyuria.  She denies any numbness of her face or difficulty speaking or walking.  She denies any family history of strokes and she denies being on any type of hormones.  She denies a history of hypertension.  She denies any visual changes.  She states she has had this before and was seen in the ED and states it lasted a couple days and it seemed to go away when she stopped eating fast food and processed food.  When I reviewed the chart from that visit she states she was having jerking in her arms and legs and she denies that this time.  PCP Celene Squibb, MD    Past Medical History:  Diagnosis Date  . Seasonal allergies     There are no active problems to display for this patient.   History reviewed. No pertinent surgical history.   OB History   No obstetric history on file.      Home Medications    Prior to Admission medications   Medication Sig Start Date End Date Taking? Authorizing Provider  oxymetazoline (MUCINEX NASAL SPRAY FULL FORCE) 0.05 % nasal spray Place 1 spray into both nostrils every evening.    [provider]    Family History Family History  Problem Relation Age of Onset  . Diabetes Mother     Social History Social History   Tobacco Use  . Smoking status: Never Smoker  . Smokeless tobacco: Never Used  Substance Use Topics  . Alcohol use: Yes    Comment: occ  . Drug use: Never     Allergies   Erythromycin   Review of Systems Review of Systems  All other systems reviewed and are negative.    Physical Exam Updated Vital Signs BP 130/89 (BP Location: Left Arm)   Pulse 73   Temp 98.2 F (36.8 C) (Oral)   Resp 18   Ht 5' 5" (1.651 m)   Wt 74.8 kg   LMP 12/24/2018 (Approximate)   SpO2 100%   BMI 27.46 kg/m   Vital signs normal    Physical Exam Vitals signs and nursing note reviewed.  Constitutional:      Appearance: Normal appearance. She is normal weight.  HENT:     Head: Normocephalic and atraumatic.     Right Ear: External ear normal.     Left Ear: External ear normal.     Nose: Nose normal.     Mouth/Throat:  Mouth: Mucous membranes are moist.     Pharynx: No oropharyngeal exudate or posterior oropharyngeal erythema.  Eyes:     Extraocular Movements: Extraocular movements intact.     Conjunctiva/sclera: Conjunctivae normal.     Pupils: Pupils are equal, round, and reactive to light.  Neck:     Musculoskeletal: Normal range of motion and neck supple. No muscular tenderness.  Cardiovascular:     Rate and Rhythm: Normal rate and regular rhythm.  Pulmonary:     Effort: Pulmonary effort is normal.  Musculoskeletal: Normal range of motion.        General: No swelling, tenderness, deformity or signs of injury.  Skin:    General: Skin is warm and dry.     Findings: No rash.  Neurological:     General: No focal deficit present.     Mental Status: She is alert and oriented to person, place, and time. Mental status is at baseline.     Cranial Nerves: No cranial nerve deficit.     Motor: No weakness.     Gait: Gait normal.     Comments:  Patient has no pronator drift, she has no upper or lower extremity weakness.  She has no apparent sensory deficit.  Psychiatric:        Mood and Affect: Mood normal.        Behavior: Behavior normal.        Thought Content: Thought content normal.        Judgment: Judgment normal.      ED Treatments / Results  Labs (all labs ordered are listed, but only abnormal results are displayed) Labs Reviewed - No data to display  EKG None  Radiology Dg Cervical Spine Complete  Result Date: 01/13/2019 CLINICAL DATA:  Upper arm tingling EXAM: CERVICAL SPINE - COMPLETE 4+ VIEW COMPARISON:  None. FINDINGS: Straightening of the cervical spine. Vertebral body heights and disc spaces are normal. Normal prevertebral soft tissue thickness. The dens and lateral masses are within normal limits. IMPRESSION: Straightening of the cervical spine.  Otherwise negative examination Electronically Signed   By: Donavan Foil M.D.   On: 01/13/2019 01:09    Procedures Procedures (including critical care time)  Medications Ordered in ED Medications - No data to display   Initial Impression / Assessment and Plan / ED Course  I have reviewed the triage vital signs and the nursing notes.  Pertinent labs & imaging results that were available during my care of the patient were reviewed by me and considered in my medical decision making (see chart for details).    I reviewed patient's past visit.  She had laboratory testing with normal being met including normal kidney function, potassium, calcium, normal CBC.  She also had a normal head CT.  At this point I did not feel her magnesium would be abnormal with a normal potassium and calcium.  Due to the upper extremity component of the symptoms today a cervical spine x-ray was done to look for any underlying abnormality there.  However I discussed with her low down on the differential would be MS.  She states her symptoms are improving.  I am going to order an outpatient  MRI and she can follow-up with her primary care doctor.  She does not have any visual complaints to suggest she needs the MRI tonight to look for MS.    I did not repeat blood work and CT tonight.  These were normal before.  Consideration was for lacunar infarct, MS,  however I feel like other etiology such as encephalitis are very unlikely.  Final Clinical Impressions(s) / ED Diagnoses   Final diagnoses:  Paresthesia    ED Discharge Orders         Ordered    MR BRAIN WO CONTRAST     01/13/19 0145         Plan discharge  Rolland Porter, MD, Barbette Or, MD 01/13/19 [phone]

## 2019-01-17 ENCOUNTER — Emergency Department (HOSPITAL_COMMUNITY): Admission: EM | Admit: 2019-01-17 | Discharge: 2019-01-17 | Discharge status: Against Medical Advice | Payer: Self-pay

## 2019-01-17 NOTE — ED Notes (Signed)
Pt left prior to triage

## 2019-02-08 ENCOUNTER — Other Ambulatory Visit: Payer: Self-pay

## 2019-02-08 DIAGNOSIS — Z20822 Contact with and (suspected) exposure to covid-19: Secondary | ICD-10-CM

## 2019-02-10 LAB — NOVEL CORONAVIRUS, NAA: SARS-CoV-2, NAA: NOT DETECTED

## 2019-02-13 ENCOUNTER — Telehealth: Payer: Self-pay | Admitting: General Practice

## 2019-02-13 NOTE — Telephone Encounter (Signed)
Patient informed of negative covid result. Patient verbalized understanding.   

## 2019-04-04 ENCOUNTER — Encounter: Payer: Self-pay | Admitting: Physician Assistant

## 2019-04-04 ENCOUNTER — Ambulatory Visit: Payer: Self-pay | Admitting: Physician Assistant

## 2019-04-04 ENCOUNTER — Other Ambulatory Visit: Payer: Self-pay

## 2019-04-04 VITALS — BP 110/70 | HR 59 | Temp 97.9°F | Wt 151.8 lb

## 2019-04-04 DIAGNOSIS — Z13220 Encounter for screening for lipoid disorders: Secondary | ICD-10-CM

## 2019-04-04 DIAGNOSIS — Z2821 Immunization not carried out because of patient refusal: Secondary | ICD-10-CM

## 2019-04-04 DIAGNOSIS — M62838 Other muscle spasm: Secondary | ICD-10-CM

## 2019-04-04 DIAGNOSIS — Z7689 Persons encountering health services in other specified circumstances: Secondary | ICD-10-CM

## 2019-04-04 NOTE — Progress Notes (Signed)
BP 110/70   Pulse (!) 59   Temp 97.9 F (36.6 C)   Wt 151 lb 12.8 oz (68.9 kg)   SpO2 99%   BMI 25.26 kg/m    Subjective:    Patient ID: Madeline Sanders, female    DOB: [birth year], 33 y.o.   MRN: [id number]  HPI: Madeline Sanders is a 33 y.o. female presenting on 04/04/2019 for New Patient (Initial Visit)   HPI     Pt had a negative covid 19 screening questionnaire   She works PT for [employer].  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  No lifting.    Pt had PAP oct 2019 at [facility]  Since June she has episodes of twitching in her R arm.  Sometimes it goes around upt into the neck and around into the L arm.    She has been to ER for this.  She has tried OTC Icyhot.  She also quit eating processed foods but still the twitching persists.    She says she did apply for [employer] financial assistance.  She had some charges for her ER visits written off.      Last visit with dr Margo Aye was in Feb- no labs were done at that time    Relevant past medical, surgical, family and social history reviewed and updated as indicated. Interim medical history since our last visit reviewed. Allergies and medications reviewed and updated.   Current Outpatient Medications:  .  cetirizine (ZYRTEC) 10 MG tablet, Take 10 mg by mouth daily as needed for allergies., Disp: , Rfl:  .  fluticasone (FLONASE) 50 MCG/ACT nasal spray, Place 2 sprays into both nostrils daily as needed for allergies or rhinitis., Disp: , Rfl:  .  Multiple Vitamin (MULTIVITAMIN) tablet, Take 1 tablet by mouth daily., Disp: , Rfl:    Review of Systems  Per HPI unless specifically indicated above     Objective:    BP 110/70   Pulse (!) 59   Temp 97.9 F (36.6 C)   Wt 151 lb 12.8 oz (68.9 kg)   SpO2 99%   BMI 25.26 kg/m   Wt Readings from Last 3 Encounters:  04/04/19 151 lb 12.8 oz (68.9 kg)  01/12/19 165 lb (74.8 kg)  08/16/18 170 lb (77.1 kg)     Physical Exam Vitals signs reviewed.  Constitutional:      Appearance: She is well-developed.  HENT:     Head: Normocephalic and atraumatic.  Eyes:     Conjunctiva/sclera: Conjunctivae normal.     Pupils: Pupils are equal, round, and reactive to light.  Neck:     Musculoskeletal: Neck supple.     Thyroid: No thyromegaly.  Cardiovascular:     Rate and Rhythm: Normal rate and regular rhythm.  Pulmonary:     Effort: Pulmonary effort is normal.     Breath sounds: Normal breath sounds.  Abdominal:     General: Bowel sounds are normal.     Palpations: Abdomen is soft. There is no mass.     Tenderness: There is no abdominal tenderness.  Musculoskeletal:     Right shoulder: She exhibits normal range of motion, no tenderness, no swelling and no deformity.     Left shoulder: She exhibits normal range of motion, no tenderness, no swelling and no deformity.     Cervical back: She exhibits tenderness. She exhibits normal range of motion  and no bony tenderness.     Right lower leg: No edema.     Left lower leg: No edema.     Comments: Mild soft tissue tenderness of the muscles going up and attaching to the posterior head  Lymphadenopathy:     Cervical: No cervical adenopathy.  Skin:    General: Skin is warm and dry.  Neurological:     Mental Status: She is alert and oriented to person, place, and time.     Gait: Gait normal.  Psychiatric:        Attention and Perception: Attention normal.        Speech: Speech normal.        Behavior: Behavior normal. Behavior is cooperative.           Assessment & Plan:   Encounter Diagnoses  Name Primary?  . Encounter to establish care Yes  . Screening cholesterol level   . Muscle spasm   . Influenza vaccination declined       -Will cHeck cholesterol, baseline labs -Pt Declines influenza immunization -discussed with pt that twitching sounds like muscle spasms and recommended heat to the area.  Also NSAIDS can be used if needed -Pt  to follow up 1 mo with evisit to review labs and recheck twitching.  She is to contact office sooner prn

## 2019-05-03 ENCOUNTER — Ambulatory Visit: Payer: Self-pay | Admitting: Physician Assistant

## 2019-05-03 ENCOUNTER — Other Ambulatory Visit (HOSPITAL_COMMUNITY)
Admission: RE | Admit: 2019-05-03 | Discharge: 2019-05-03 | Disposition: A | Discharge status: Home / Self Care | Payer: Self-pay | Source: Ambulatory Visit | Attending: Physician Assistant | Admitting: Physician Assistant

## 2019-05-03 DIAGNOSIS — Z13220 Encounter for screening for lipoid disorders: Secondary | ICD-10-CM | POA: Insufficient documentation

## 2019-05-03 DIAGNOSIS — M62838 Other muscle spasm: Secondary | ICD-10-CM | POA: Insufficient documentation

## 2019-05-03 LAB — COMPREHENSIVE METABOLIC PANEL
ALT: 13 U/L (ref 0–44)
AST: 15 U/L (ref 15–41)
Albumin: 4.3 g/dL (ref 3.5–5.0)
Alkaline Phosphatase: 41 U/L (ref 38–126)
Anion gap: 7 (ref 5–15)
BUN: 16 mg/dL (ref 6–20)
CO2: 24 mmol/L (ref 22–32)
Calcium: 9.1 mg/dL (ref 8.9–10.3)
Chloride: 107 mmol/L (ref 98–111)
Creatinine, Ser: 0.78 mg/dL (ref 0.44–1.00)
GFR calc Af Amer: >60 mL/min (ref >60)
GFR calc non Af Amer: >60 mL/min (ref >60)
Glucose, Bld: 84 mg/dL (ref 70–99)
Potassium: 4.7 mmol/L (ref 3.5–5.1)
Sodium: 138 mmol/L (ref 135–145)
Total Bilirubin: 1 mg/dL (ref 0.3–1.2)
Total Protein: 7.2 g/dL (ref 6.5–8.1)

## 2019-05-03 LAB — LIPID PANEL
Cholesterol: 171 mg/dL (ref 0–200)
HDL: 59 mg/dL (ref >40)
LDL Cholesterol: 108 mg/dL — ABNORMAL HIGH (ref 0–99)
Total CHOL/HDL Ratio: 2.9 RATIO
Triglycerides: 20 mg/dL (ref <150)
VLDL: 4 mg/dL (ref 0–40)

## 2019-05-09 ENCOUNTER — Encounter: Payer: Self-pay | Admitting: Physician Assistant

## 2019-05-09 ENCOUNTER — Ambulatory Visit: Payer: Self-pay | Admitting: Physician Assistant

## 2019-05-09 DIAGNOSIS — Z2821 Immunization not carried out because of patient refusal: Secondary | ICD-10-CM

## 2019-05-09 DIAGNOSIS — Z0000 Encounter for general adult medical examination without abnormal findings: Secondary | ICD-10-CM

## 2019-05-09 NOTE — Progress Notes (Signed)
There were no vitals taken for this visit.   Subjective:    Patient ID: Madeline Sanders, female    DOB: 01-25-1986, 33 y.o.   MRN: [id number]  HPI: Madeline Sanders is a 33 y.o. female presenting on 05/09/2019 for No chief complaint on file.   HPI    We will this is a telemedicine appointment due to coronavirus pandemic.  It is via telephone as patient is out running errands and is not at home.    I connected with  Madeline Sanders on 05/09/19 by a video enabled telemedicine application and verified that I am speaking with the correct person using two identifiers.   I discussed the limitations of evaluation and management by telemedicine. The patient expressed understanding and agreed to proceed.  Patient is currently in her parked car in a parking lot in town.  Provider is at office.     Patient has declined influenza immunization.    Patient states she had a normal Pap smear at the [facility] department in October 2019.  Patient is feeling well today and has no complaints.    As discussed at previous appointment, patient has made many lifestyle changes recently including eating more healthy and exercising regular.    Relevant past medical, surgical, family and social history reviewed and updated as indicated. Interim medical history since our last visit reviewed. Allergies and medications reviewed and updated.  CURRENT MEDS: MVI daily    Review of Systems  Per HPI unless specifically indicated above     Objective:    There were no vitals taken for this visit.  Wt Readings from Last 3 Encounters:  04/04/19 151 lb 12.8 oz (68.9 kg)  01/12/19 165 lb (74.8 kg)  08/16/18 170 lb (77.1 kg)    Physical Exam Pulmonary:     Effort: No respiratory distress.  Neurological:     Mental Status: She is alert and oriented to person, place, and time.  Psychiatric:        Attention and Perception: Attention normal.        Speech: Speech normal.      Behavior: Behavior is cooperative.     Results for orders placed or performed during the hospital encounter of 05/03/19  Lipid panel  Result Value Ref Range   Cholesterol 171 0 - 200 mg/dL   Triglycerides 20 <003 mg/dL   HDL 59 >49 mg/dL   Total CHOL/HDL Ratio 2.9 RATIO   VLDL 4 0 - 40 mg/dL   LDL Cholesterol 179 (H) 0 - 99 mg/dL  Comprehensive metabolic panel  Result Value Ref Range   Sodium 138 135 - 145 mmol/L   Potassium 4.7 3.5 - 5.1 mmol/L   Chloride 107 98 - 111 mmol/L   CO2 24 22 - 32 mmol/L   Glucose, Bld 84 70 - 99 mg/dL   BUN 16 6 - 20 mg/dL   Creatinine, Ser 1.50 0.44 - 1.00 mg/dL   Calcium 9.1 8.9 - 56.9 mg/dL   Total Protein 7.2 6.5 - 8.1 g/dL   Albumin 4.3 3.5 - 5.0 g/dL   AST 15 15 - 41 U/L   ALT 13 0 - 44 U/L   Alkaline Phosphatase 41 38 - 126 U/L   Total Bilirubin 1.0 0.3 - 1.2 mg/dL   GFR calc non Af Amer >60 >60 mL/min   GFR calc Af Amer >60 >60 mL/min   Anion gap 7 5 - 15      Assessment &  Plan:    Encounter Diagnoses  Name Primary?  . Influenza vaccination declined Yes  . Encounter for health check      Reviewed labs with patient.  Patient is to continue healthy lifestyle including nutritious diet and regular exercise.  Patient will follow up in office in 1 year.  Patient is to contact office sooner as needed.

## 2019-06-06 ENCOUNTER — Other Ambulatory Visit: Payer: Self-pay

## 2019-06-06 DIAGNOSIS — Z20822 Contact with and (suspected) exposure to covid-19: Secondary | ICD-10-CM

## 2019-06-07 LAB — NOVEL CORONAVIRUS, NAA: SARS-CoV-2, NAA: NOT DETECTED

## 2019-07-10 ENCOUNTER — Ambulatory Visit: Payer: Self-pay | Admitting: Physician Assistant

## 2019-07-12 ENCOUNTER — Other Ambulatory Visit: Payer: Self-pay

## 2019-07-23 ENCOUNTER — Ambulatory Visit: Payer: Self-pay | Admitting: Physician Assistant

## 2019-08-09 ENCOUNTER — Ambulatory Visit: Payer: Self-pay | Admitting: Physician Assistant

## 2019-09-11 ENCOUNTER — Ambulatory Visit: Payer: Self-pay | Attending: Internal Medicine

## 2019-09-11 DIAGNOSIS — Z23 Encounter for immunization: Secondary | ICD-10-CM | POA: Insufficient documentation

## 2019-09-11 NOTE — Progress Notes (Signed)
   Covid-19 Vaccination Clinic  Name:  Madeline Sanders    MRN: [id number] DOB: March 16, 1986  09/11/2019  [name] was observed post Covid-19 immunization for 15 minutes  without incident. She was provided with Vaccine Information Sheet and instruction to access the V-Safe system.   [name] was instructed to call 911 with any severe reactions post vaccine: Marland Kitchen Difficulty breathing  . Swelling of face and throat  . A fast heartbeat  . A bad rash all over body  . Dizziness and weakness   Immunizations Administered    Name Date Dose VIS Date Route   Moderna COVID-19 Vaccine 09/11/2019  1:09 PM 0.5 mL 06/12/2019 Intramuscular   Manufacturer: Moderna   Lot: [id number]   NDC: 70658-260-88

## 2019-10-09 ENCOUNTER — Ambulatory Visit: Payer: Self-pay | Attending: Internal Medicine

## 2019-10-09 DIAGNOSIS — Z23 Encounter for immunization: Secondary | ICD-10-CM

## 2019-10-09 NOTE — Progress Notes (Signed)
   Covid-19 Vaccination Clinic  Name:  Madeline Sanders    MRN: [id number] DOB: [birth year]  10/09/2019  [name] was observed post Covid-19 immunization for 15 minutes without incident. She was provided with Vaccine Information Sheet and instruction to access the V-Safe system.   [name] was instructed to call 911 with any severe reactions post vaccine: Marland Kitchen Difficulty breathing  . Swelling of face and throat  . A fast heartbeat  . A bad rash all over body  . Dizziness and weakness   Immunizations Administered    Name Date Dose VIS Date Route   Moderna COVID-19 Vaccine 10/09/2019 12:14 PM 0.5 mL 06/12/2019 Intramuscular   Manufacturer: Moderna   Lot: [id number]   NDC: 51761-607-37

## 2019-11-25 IMAGING — DX CERVICAL SPINE - COMPLETE 4+ VIEW
7 series · 7 of 7 positions shown · non-contrast
Comparison: None.

CLINICAL DATA: Upper arm tingling

EXAM:
CERVICAL SPINE - COMPLETE 4+ VIEW

[c-spine obl (1 of 2)]
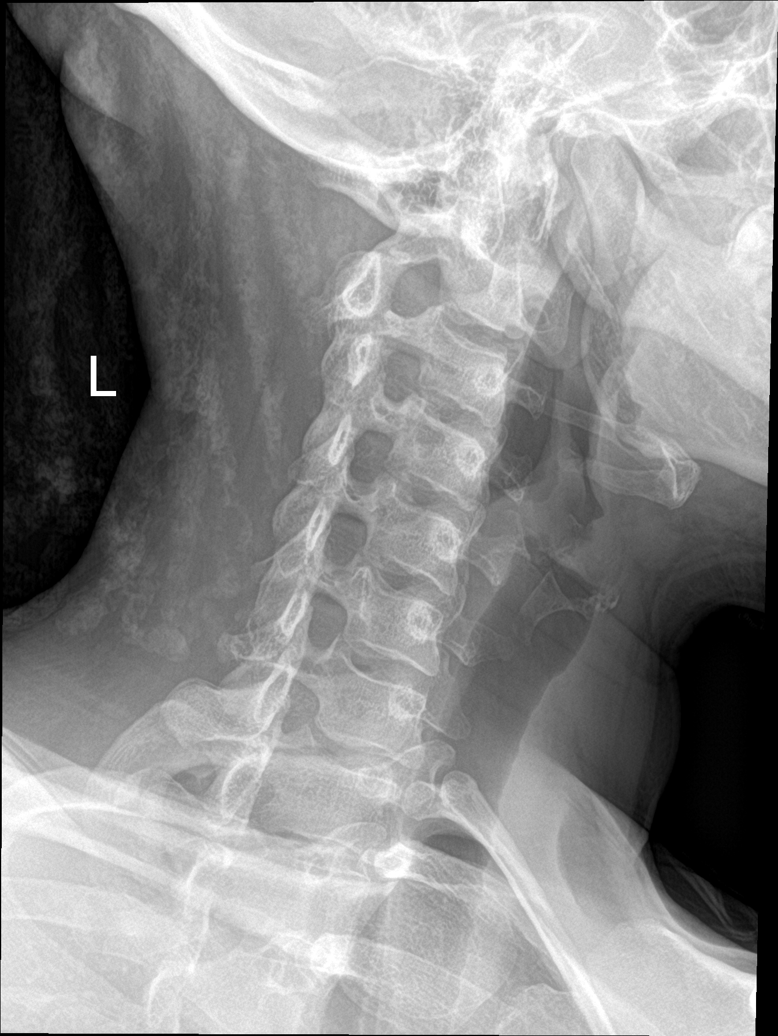

[c-spine obl (2 of 2)]
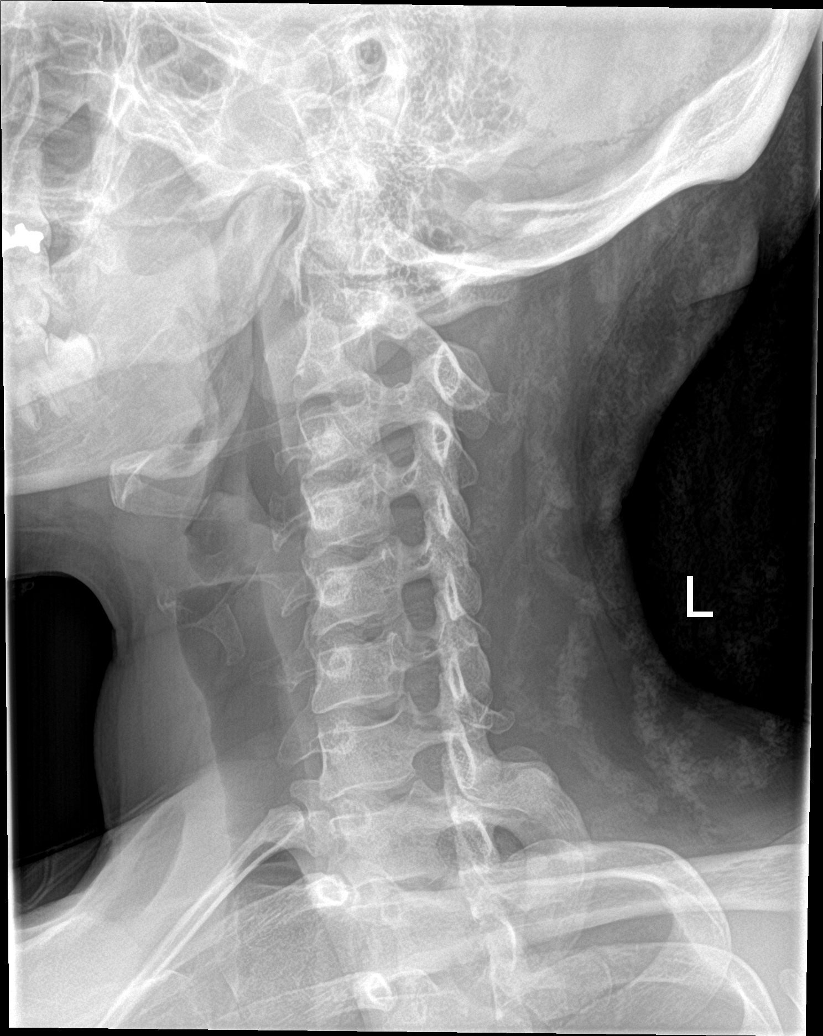

[c-spine ap (1 of 2)]
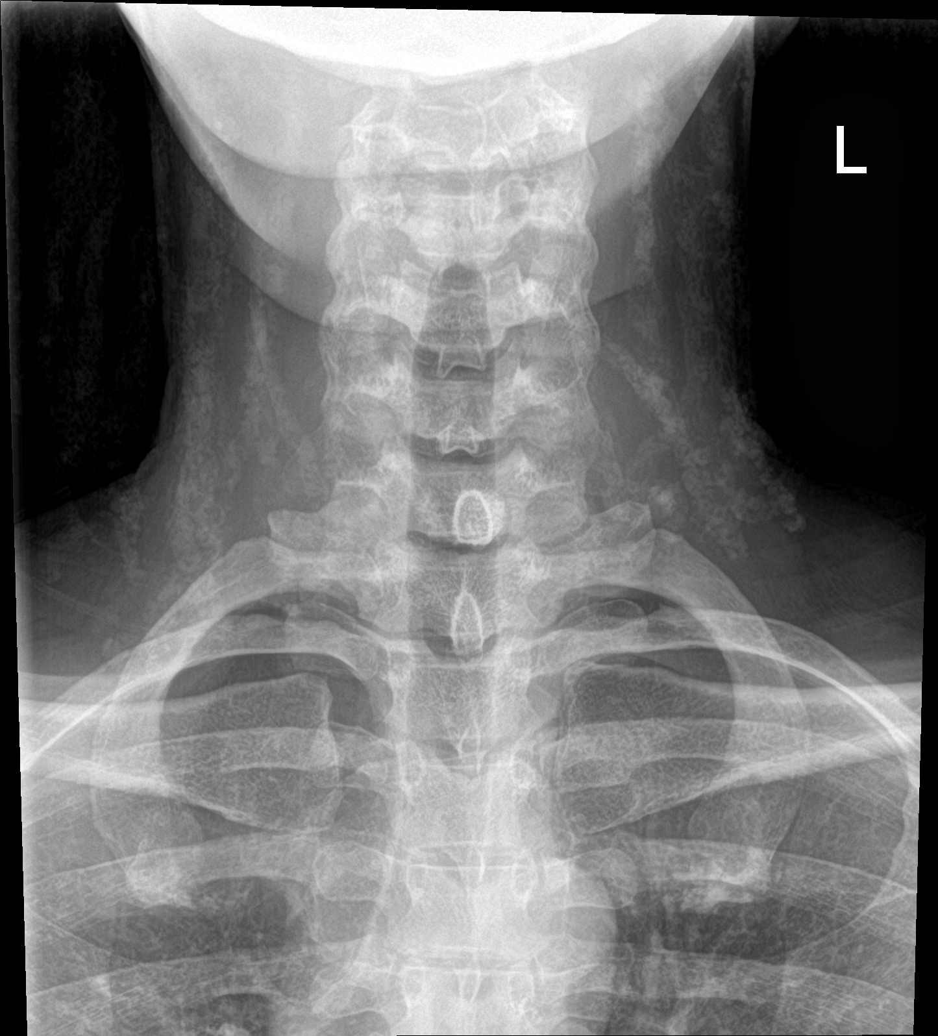

[c-spine open mouth]
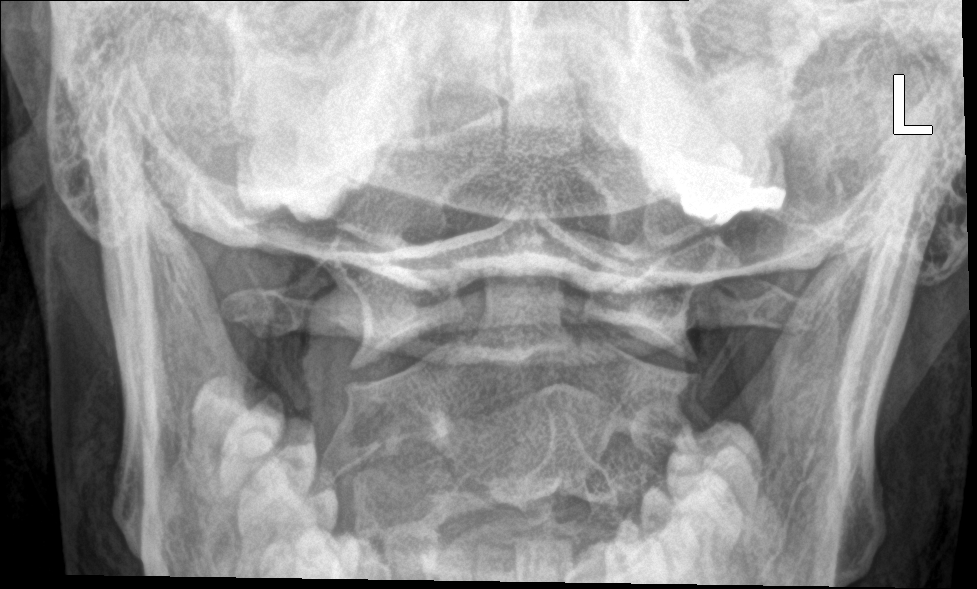

[c-spine swimmers trauma]
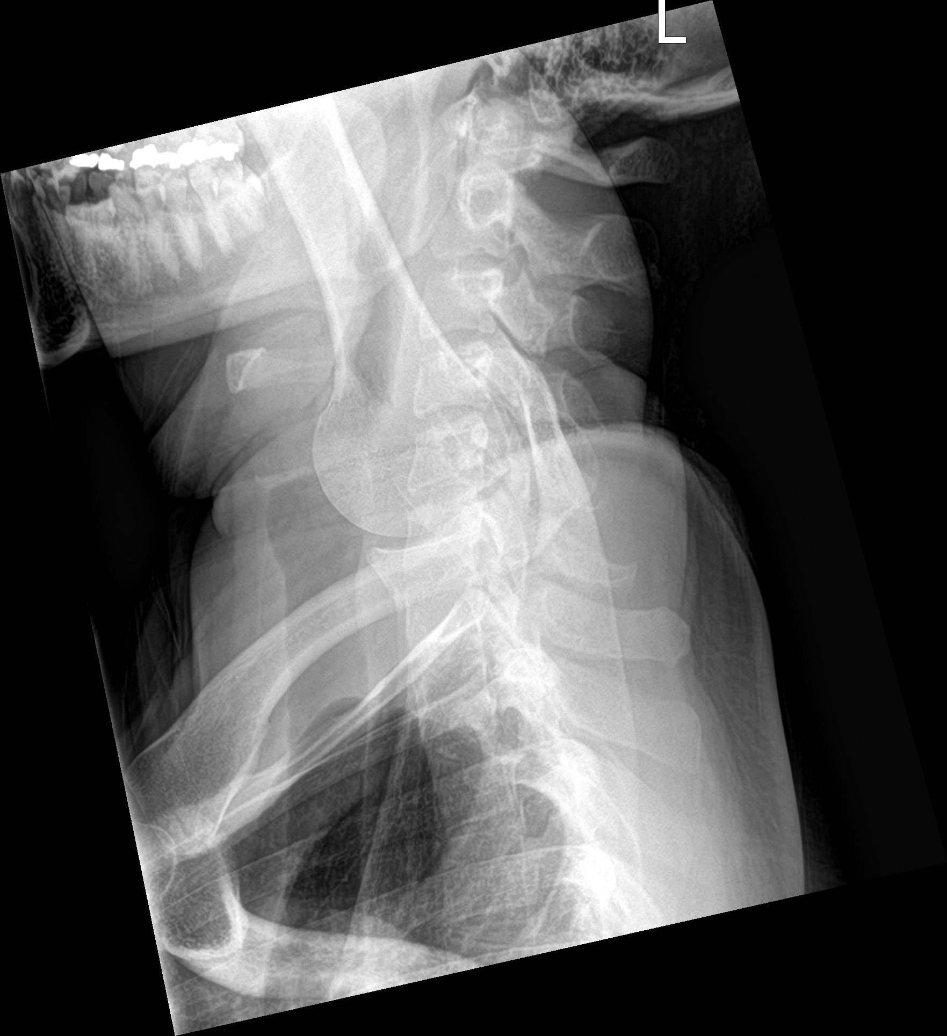

[c-spine lat]
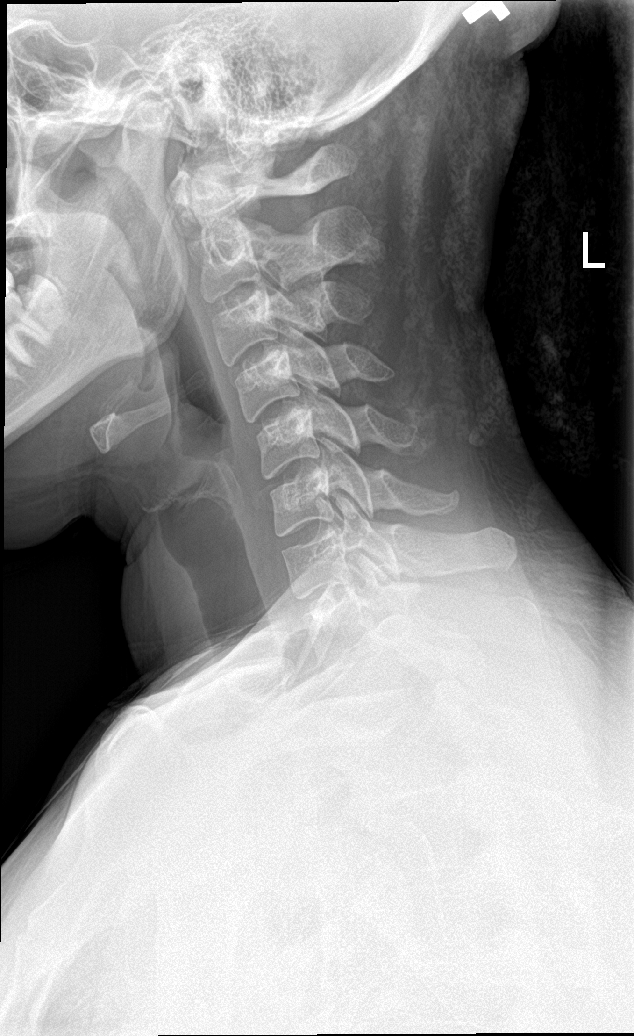

[c-spine ap (2 of 2)]
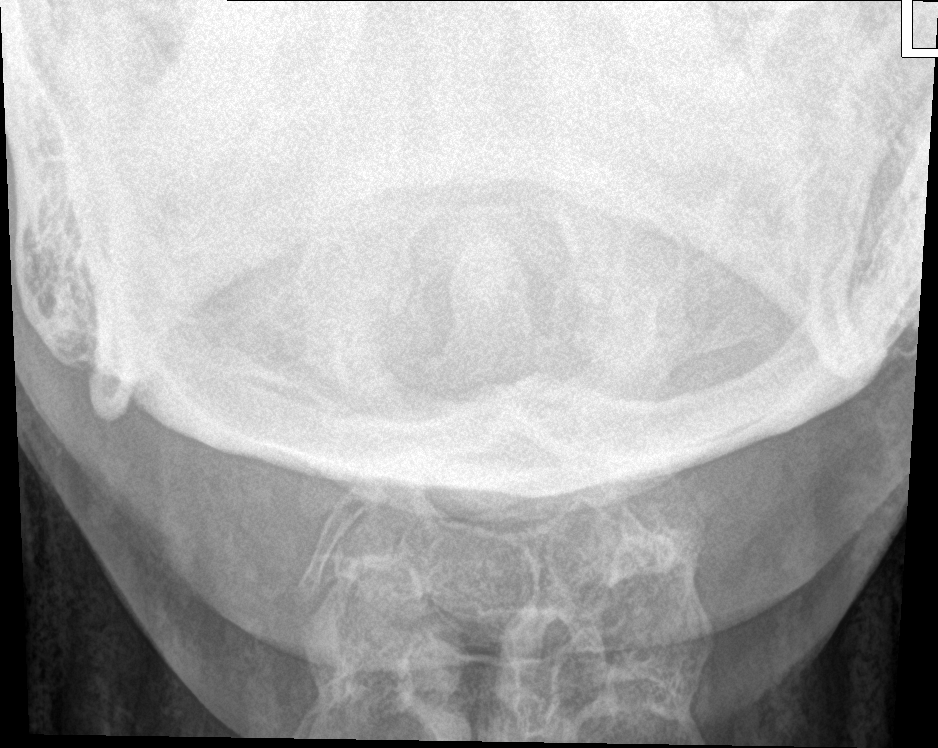

[7 of 7 positions shown; findings below may reference images not displayed]

FINDINGS: Straightening of the cervical spine. Vertebral body heights and disc
spaces are normal. Normal prevertebral soft tissue thickness. The
dens and lateral masses are within normal limits.
IMPRESSION: Straightening of the cervical spine.  Otherwise negative examination

## 2020-02-23 ENCOUNTER — Emergency Department (HOSPITAL_COMMUNITY)
Admission: EM | Admit: 2020-02-23 | Discharge: 2020-02-23 | Disposition: A | Discharge status: Home / Self Care | Payer: No Typology Code available for payment source | Attending: Emergency Medicine | Admitting: Emergency Medicine

## 2020-02-23 ENCOUNTER — Emergency Department (HOSPITAL_COMMUNITY): Payer: No Typology Code available for payment source

## 2020-02-23 ENCOUNTER — Encounter (HOSPITAL_COMMUNITY): Payer: Self-pay

## 2020-02-23 DIAGNOSIS — S161XXA Strain of muscle, fascia and tendon at neck level, initial encounter: Secondary | ICD-10-CM | POA: Diagnosis not present

## 2020-02-23 DIAGNOSIS — S46912A Strain of unspecified muscle, fascia and tendon at shoulder and upper arm level, left arm, initial encounter: Secondary | ICD-10-CM

## 2020-02-23 DIAGNOSIS — Y998 Other external cause status: Secondary | ICD-10-CM | POA: Insufficient documentation

## 2020-02-23 DIAGNOSIS — Y92410 Unspecified street and highway as the place of occurrence of the external cause: Secondary | ICD-10-CM | POA: Insufficient documentation

## 2020-02-23 DIAGNOSIS — Z79899 Other long term (current) drug therapy: Secondary | ICD-10-CM | POA: Insufficient documentation

## 2020-02-23 DIAGNOSIS — Y9389 Activity, other specified: Secondary | ICD-10-CM | POA: Insufficient documentation

## 2020-02-23 DIAGNOSIS — S4992XA Unspecified injury of left shoulder and upper arm, initial encounter: Secondary | ICD-10-CM | POA: Diagnosis present

## 2020-02-23 MED ORDER — METHOCARBAMOL 500 MG PO TABS: 500.0000 mg | ORAL_TABLET | Freq: Two times a day (BID) | ORAL | 0 refills | Status: DC

## 2020-02-23 MED ORDER — IBUPROFEN 600 MG PO TABS: 600.0000 mg | ORAL_TABLET | Freq: Four times a day (QID) | ORAL | 0 refills | Status: DC

## 2020-02-23 MED ORDER — HYDROCODONE-ACETAMINOPHEN 5-325 MG PO TABS
1.0000 | ORAL_TABLET | Freq: Once | ORAL | Status: AC
  Administered 2020-02-23: 1 via ORAL
  Filled 2020-02-23: qty 1

## 2020-02-23 MED ORDER — IBUPROFEN 600 MG PO TABS: 600.0000 mg | ORAL_TABLET | Freq: Four times a day (QID) | ORAL | 0 refills | Status: AC
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
## 2020-04-21 ENCOUNTER — Ambulatory Visit: Payer: Self-pay | Admitting: Physician Assistant

## 2020-04-28 ENCOUNTER — Other Ambulatory Visit: Payer: Self-pay

## 2020-04-28 ENCOUNTER — Ambulatory Visit: Payer: Self-pay | Admitting: Physician Assistant

## 2020-04-28 ENCOUNTER — Encounter: Payer: Self-pay | Admitting: Physician Assistant

## 2020-04-28 VITALS — BP 121/78 | HR 59 | Temp 96.4°F | Wt 153.0 lb

## 2020-04-28 DIAGNOSIS — Z7689 Persons encountering health services in other specified circumstances: Secondary | ICD-10-CM

## 2020-04-28 DIAGNOSIS — Z0000 Encounter for general adult medical examination without abnormal findings: Secondary | ICD-10-CM
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
## 2020-08-11 ENCOUNTER — Encounter: Payer: Self-pay | Admitting: Physician Assistant

## 2020-08-11 ENCOUNTER — Ambulatory Visit: Payer: Self-pay | Admitting: Physician Assistant

## 2020-08-11 DIAGNOSIS — J029 Acute pharyngitis, unspecified: Secondary | ICD-10-CM

## 2020-08-11 DIAGNOSIS — R059 Cough, unspecified: Secondary | ICD-10-CM

## 2020-08-11 DIAGNOSIS — Z20822 Contact with and (suspected) exposure to covid-19: Secondary | ICD-10-CM

## 2020-08-11 DIAGNOSIS — R110 Nausea: Secondary | ICD-10-CM

## 2020-08-11 NOTE — Progress Notes (Signed)
   There were no vitals taken for this visit.   Subjective:    Patient ID: Madeline Sanders, female    DOB: Nov 30, 1985, 35 y.o.   MRN: [id number]  HPI: Madeline Sanders is a 35 y.o. female presenting on 08/11/2020 for No chief complaint on file.   HPI    This is a telemedicine appointment through [employer] due to coronavirus pandemic.  I connected with  Madeline Sanders on 08/11/20 by a video enabled telemedicine application and verified that I am speaking with the correct person using two identifiers.   I discussed the limitations of evaluation and management by telemedicine. The patient expressed understanding and agreed to proceed.  Pt is at home.  Provider is at office.    Pt appointment today is for pt is sick.  She Started feeling bad on Friday.  She has Sore Throat.  Over the weekend, she felt like her chest was hurting and she had Nausea and  Cough and Felt feverish.  She did not check her temperature. 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     Relevant past medical, surgical, family and social history reviewed and updated as indicated. Interim medical history since our last visit reviewed. Allergies and medications reviewed and updated.  No current outpatient medications on file.     Review of Systems  Per HPI unless specifically indicated above     Objective:    There were no vitals taken for this visit.  Wt Readings from Last 3 Encounters:  04/28/20 153 lb (69.4 kg)  02/23/20 155 lb (70.3 kg)  04/04/19 151 lb 12.8 oz (68.9 kg)    Physical Exam Constitutional:      General: She is not in acute distress.    Appearance: She is not toxic-appearing.  HENT:     Head: Normocephalic and atraumatic.  Pulmonary:     Effort: No respiratory distress.  Neurological:     Mental Status: She is alert and oriented to person, place, and time.  Psychiatric:        Attention and Perception: Attention normal.         Mood and Affect: Mood normal.        Behavior: Behavior is cooperative.               Assessment & Plan:    Encounter Diagnoses  Name Primary?  . Person under investigation for COVID-19 Yes  . Sore throat   . Cough   . Nausea      -discussed that she needs covid testing before she can return to work.  She says she has already called her employer.  A note for work will be emailed to pt.   -discussed options for testing and she says she wants to get it at [facility] testing site.  She is asked to call this office with test result -discussed isolation until she gets her results

## 2020-08-13 ENCOUNTER — Telehealth: Payer: Self-pay | Admitting: Physician Assistant

## 2020-08-13 NOTE — Telephone Encounter (Signed)
Patient sent an e-mail that she tested for Covid at  [facility] on Monday and her results were positive.

## 2020-10-14 ENCOUNTER — Telehealth: Payer: Self-pay | Admitting: Physician Assistant

## 2020-10-14 ENCOUNTER — Other Ambulatory Visit: Payer: Self-pay | Admitting: Physician Assistant

## 2020-10-14 MED ORDER — BENZONATATE 100 MG PO CAPS: ORAL_CAPSULE | ORAL | 1 refills | Status: DC

## 2020-10-14 NOTE — Telephone Encounter (Signed)
Pt called reporting cough and congestion.  She had ST also but says Nyquil helped the ST go away.  She is also using flonase.  She doesn't know if she has a cold or allergies.  Her symptoms started last week.  She took a home covid test that was negative.  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   Pt is encouraged to get PCR covid test; she says she will got to [facility] where she had a test in the past.  She is encouraged to use antihistamine.  rx tessalon is sent to her pharmacy of choice.  She can continue the flonase if desire.  Pt is to contact office with covid results and also if her symptoms fail to resolve.  She expresses agreement with plan.

## 2021-04-27 ENCOUNTER — Ambulatory Visit: Payer: Self-pay | Admitting: Physician Assistant

## 2021-04-28 ENCOUNTER — Ambulatory Visit: Payer: Self-pay | Admitting: Physician Assistant

## 2021-05-25 ENCOUNTER — Encounter: Payer: Self-pay | Admitting: Physician Assistant

## 2021-05-25 ENCOUNTER — Ambulatory Visit: Payer: Self-pay | Admitting: Physician Assistant

## 2021-05-25 VITALS — BP 128/76 | HR 67 | Temp 98.6°F | Wt 158.0 lb

## 2021-05-25 DIAGNOSIS — H6121 Impacted cerumen, right ear: Secondary | ICD-10-CM

## 2021-05-25 DIAGNOSIS — Z0000 Encounter for general adult medical examination without abnormal findings: Secondary | ICD-10-CM

## 2021-05-25 NOTE — Progress Notes (Signed)
BP 128/76   Pulse 67   Temp 98.6 F (37 C)   Wt 158 lb (71.7 kg)   SpO2 99%   BMI 26.29 kg/m    Subjective:    Patient ID: Madeline Sanders, female    DOB: 1985-08-26, 35 y.o.   MRN: [id number]  HPI: Madeline Sanders is a 35 y.o. 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   She says she is doing well and has no complaints.  She is not regularly exercising but is hoping to get that started soon.        Relevant past medical, surgical, family and social history reviewed and updated as indicated. Interim medical history since our last visit reviewed. Allergies and medications reviewed and updated.    No current outpatient medications on file.     Review of Systems  Per HPI unless specifically indicated above     Objective:    BP 128/76   Pulse 67   Temp 98.6 F (37 C)   Wt 158 lb (71.7 kg)   SpO2 99%   BMI 26.29 kg/m   Wt Readings from Last 3 Encounters:  05/25/21 158 lb (71.7 kg)  04/28/20 153 lb (69.4 kg)  02/23/20 155 lb (70.3 kg)    Physical Exam Vitals reviewed.  Constitutional:      General: She is not in acute distress.    Appearance: She is well-developed. She is not ill-appearing.  HENT:     Head: Normocephalic and atraumatic.     Right Ear: Tympanic membrane, ear canal and external ear normal. There is impacted cerumen.     Left Ear: Tympanic membrane, ear canal and external ear normal.     Ears:     Comments: R TM clear after lavage Eyes:     Extraocular Movements: Extraocular movements intact.     Conjunctiva/sclera: Conjunctivae normal.     Pupils: Pupils are equal, round, and reactive to light.  Neck:     Thyroid: No thyromegaly.  Cardiovascular:     Rate and Rhythm: Normal rate and regular rhythm.  Pulmonary:     Effort: Pulmonary effort is normal.     Breath sounds: Normal breath  sounds.  Abdominal:     General: Bowel sounds are normal.     Palpations: Abdomen is soft. There is no mass.     Tenderness: There is no abdominal tenderness.  Musculoskeletal:     Cervical back: Neck supple.     Right lower leg: No edema.     Left lower leg: No edema.  Lymphadenopathy:     Cervical: No cervical adenopathy.  Skin:    General: Skin is warm and dry.  Neurological:     Mental Status: She is alert and oriented to person, place, and time.     Motor: No weakness or tremor.     Gait: Gait is intact. Gait normal.     Deep Tendon Reflexes:     Reflex Scores:      Patellar reflexes are 2+ on the right side and 2+ on the left side. Psychiatric:        Attention and Perception: Attention normal.        Mood and Affect: Mood normal.        Speech: Speech normal.  Behavior: Behavior normal. Behavior is cooperative.     Comments: Pleasant and engaged         Assessment & Plan:   Encounter Diagnoses  Name Primary?   Encounter for annual health examination Yes   Impacted cerumen of right ear       -Pt Declines flu shot -She will check her covid card to see if booster needed.  She is counseled on where she can get this at no cost if she has not yet had her booster -record request sent for PAP report from [facility] -encouraged monthly BSE -encouraged condom use if she becomes sexually active -pt to follow up 1 year.  She is to contact office sooner prn

## 2021-06-08 ENCOUNTER — Encounter: Payer: Self-pay | Admitting: Physician Assistant

## 2021-11-16 ENCOUNTER — Encounter: Payer: Self-pay | Admitting: Physician Assistant

## 2021-11-16 ENCOUNTER — Ambulatory Visit: Payer: Self-pay | Admitting: Physician Assistant

## 2021-11-16 DIAGNOSIS — J302 Other seasonal allergic rhinitis: Secondary | ICD-10-CM

## 2021-11-16 NOTE — Progress Notes (Signed)
? ?  There were no vitals taken for this visit.  ? ?Subjective:  ? ? Patient ID: Madeline Sanders, female    DOB: [birth year], 36 y.o.   MRN: [id number] ? ?HPI: ?Madeline Sanders is a 36 y.o. female presenting on 11/16/2021 for No chief complaint on file. ? ? ?HPI ? ? ?This is a telemedicine appointment through [employer] ? ?I connected with  Madeline Sanders on 11/16/21 by a video enabled telemedicine application and verified that I am speaking with the correct person using two identifiers. ?  ?I discussed the limitations of evaluation and management by telemedicine. The patient expressed understanding and agreed to proceed. ? ?Pt is in parked car in the parking lot at [employer] where she went to get some lunch.  Provider is at office.  ? ? ? ?Pt reports that her Throat is scratchy.  She has some Drainage down back of throat.  She took some benedryl and tylenol yesterday.    Last night she felt hot.  She will occasionally have a dry cough.  ? ?She took a Covid test today that was negative ? 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  She did not go in to work today ? ?She has history of seasonal allergies.  ? ? ? ?Relevant past medical, surgical, family and social history reviewed and updated as indicated. Interim medical history since our last visit reviewed. ?Allergies and medications reviewed and updated. ? ?No current outpatient medications on file. ? ? ? ?Review of Systems ? ?Per HPI unless specifically indicated above ? ?   ?Objective:  ?  ?There were no vitals taken for this visit.  ?Wt Readings from Last 3 Encounters:  ?05/25/21 158 lb (71.7 kg)  ?04/28/20 153 lb (69.4 kg)  ?02/23/20 155 lb (70.3 kg)  ?  ?Physical Exam ?Constitutional:   ?   General: She is not in acute distress. ?   Appearance: Normal appearance. She is not ill-appearing or toxic-appearing.  ?   Comments: Pt is smiling and talking pleasantly; she does not appear to feel bad.   ?HENT:  ?   Head: Normocephalic and atraumatic.  ?Pulmonary:  ?    Effort: Pulmonary effort is normal. No respiratory distress.  ?   Comments: Pt is talking in complete sentences without dyspnea ?Neurological:  ?   Mental Status: She is alert and oriented to person, place, and time.  ?Psychiatric:     ?   Behavior: Behavior normal.  ? ? ? ? ?   ?Assessment & Plan:  ? ? ?Encounter Diagnosis  ?Name Primary?  ? Seasonal allergies Yes  ? ? ? ?Pt encouraged to use claritin or allegra.  She is encouarged to gargle with warm salt water.  She also can drink warm tea with honey ?She is to contact office if worsens or persists ?

## 2022-05-17 ENCOUNTER — Ambulatory Visit: Payer: Self-pay | Admitting: Physician Assistant

## 2022-05-17 ENCOUNTER — Encounter: Payer: Self-pay | Admitting: Physician Assistant

## 2022-05-17 VITALS — BP 116/79 | HR 72 | Temp 96.9°F | Wt 155.0 lb

## 2022-05-17 DIAGNOSIS — Z0000 Encounter for general adult medical examination without abnormal findings: Secondary | ICD-10-CM

## 2022-05-17 NOTE — Progress Notes (Signed)
BP 116/79   Pulse 72   Temp (!) 96.9 F (36.1 C)   Wt 155 lb (70.3 kg)   LMP 05/12/2022   SpO2 99%   BMI 25.79 kg/m    Subjective:    Patient ID: Madeline Sanders, female    DOB: 1986/06/04, 36 y.o.   MRN: [id number]  HPI: Madeline Sanders is a 36 y.o. female presenting on 05/17/2022 for Annual Exam   HPI   Chief Complaint  Patient presents with   Annual Exam     Pt is 29yoF who presents for annual wellness exam.    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  She says she is Not exercising regularly.    She does BSE regularly.  She says she has no complaints and denies cp, sob,  abdominal pain, depression or anxiety.     Relevant past medical, surgical, family and social history reviewed and updated as indicated. Interim medical history since our last visit reviewed. Allergies and medications reviewed and updated.  No current outpatient medications on file.    Review of Systems  Per HPI unless specifically indicated above     Objective:    BP 116/79   Pulse 72   Temp (!) 96.9 F (36.1 C)   Wt 155 lb (70.3 kg)   LMP 05/12/2022   SpO2 99%   BMI 25.79 kg/m   Wt Readings from Last 3 Encounters:  05/17/22 155 lb (70.3 kg)  05/25/21 158 lb (71.7 kg)  04/28/20 153 lb (69.4 kg)    Physical Exam Vitals reviewed.  Constitutional:      General: She is not in acute distress.    Appearance: She is well-developed. She is not ill-appearing or toxic-appearing.  HENT:     Head: Normocephalic and atraumatic.     Right Ear: Tympanic membrane, ear canal and external ear normal.     Left Ear: Tympanic membrane, ear canal and external ear normal.  Eyes:     Extraocular Movements: Extraocular movements intact.     Conjunctiva/sclera: Conjunctivae normal.     Pupils: Pupils are equal, round, and reactive to light.  Neck:     Thyroid: No thyromegaly.  Cardiovascular:     Rate and Rhythm: Normal rate and regular rhythm.   Pulmonary:     Effort: Pulmonary effort is normal.     Breath sounds: Normal breath sounds.  Abdominal:     General: Bowel sounds are normal.     Palpations: Abdomen is soft. There is no hepatomegaly, splenomegaly, mass or pulsatile mass.     Tenderness: There is no abdominal tenderness. There is no guarding or rebound.  Musculoskeletal:     Right shoulder: Normal range of motion.     Left shoulder: Normal range of motion.     Right wrist: Normal range of motion.     Left wrist: Normal range of motion.     Cervical back: Neck supple.     Right lower leg: No edema.     Left lower leg: No edema.  Lymphadenopathy:     Cervical: No cervical adenopathy.  Skin:    General: Skin is warm and dry.  Neurological:     Mental Status: She is alert and oriented to person, place, and time.     Cranial Nerves: No facial asymmetry.     Motor: No weakness or tremor.     Coordination: Romberg sign negative. Coordination normal.  Gait: Gait is intact. Gait normal.     Deep Tendon Reflexes:     Reflex Scores:      Patellar reflexes are 2+ on the right side and 2+ on the left side. Psychiatric:        Attention and Perception: Attention normal.        Mood and Affect: Mood normal.        Speech: Speech normal.        Behavior: Behavior normal. Behavior is cooperative.     Comments: Pleasant and engaged           Assessment & Plan:    Encounter Diagnosis  Name Primary?   Encounter for annual health examination Yes     -pt counseled on healthy lifestyle including participating in regular exercise like walking, eating well balanced meals.  Encouraged condoms for decreasing risk of STD and unwanted pregnancy.  Recommended BSE -labs reviewed.  No updates needed at this time -pt to follow up 1 year.  She is to contact office sooner prn

## 2022-07-28 ENCOUNTER — Encounter: Payer: Self-pay | Admitting: Physician Assistant

## 2022-09-01 ENCOUNTER — Encounter: Payer: Self-pay | Admitting: Physician Assistant

## 2022-09-01 ENCOUNTER — Ambulatory Visit: Payer: Self-pay | Admitting: Physician Assistant

## 2022-09-01 VITALS — BP 121/82 | HR 72 | Temp 97.4°F | Wt 162.0 lb

## 2022-09-01 DIAGNOSIS — R519 Headache, unspecified: Secondary | ICD-10-CM

## 2022-09-01 DIAGNOSIS — Z09 Encounter for follow-up examination after completed treatment for conditions other than malignant neoplasm: Secondary | ICD-10-CM

## 2022-09-01 NOTE — Progress Notes (Signed)
BP 121/82   Pulse 72   Temp (!) 97.4 F (36.3 C)   Wt 162 lb (73.5 kg)   SpO2 97%   BMI 26.96 kg/m    Subjective:    Patient ID: Madeline Sanders, female    DOB: Apr 11, 1986, 37 y.o.   MRN: [id number]  HPI: Madeline Sanders is a 37 y.o. female presenting on 09/01/2022 for Headache (Pt states she gets headaches after eating blueberries from certain locations. HA lasting for several hours but went away after taking medication. Pt states she never had problems in the past when eating blueberries)   HPI  Chief Complaint  Patient presents with   Headache    Pt states she gets headaches after eating blueberries from certain locations. HA lasting for several hours but went away after taking medication. Pt states she never had problems in the past when eating blueberries    She says she has had 2 episodes of HA.   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  She says she has a lot of stress with work.    Today she is feeling well and has no HA or other complaint.   She says she is exercising regularly.    Relevant past medical, surgical, family and social history reviewed and updated as indicated. Interim medical history since our last visit reviewed. Allergies and medications reviewed and updated.   No current outpatient medications on file.    Review of Systems  Per HPI unless specifically indicated above     Objective:    BP 121/82   Pulse 72   Temp (!) 97.4 F (36.3 C)   Wt 162 lb (73.5 kg)   SpO2 97%   BMI 26.96 kg/m   Wt Readings from Last 3 Encounters:  09/01/22 162 lb (73.5 kg)  05/17/22 155 lb (70.3 kg)  05/25/21 158 lb (71.7 kg)    Physical Exam Vitals reviewed.  Constitutional:      General: She is not in acute distress.    Appearance: She is well-developed. She is not ill-appearing or toxic-appearing.  HENT:     Head: Normocephalic and atraumatic.     Right Ear: Tympanic membrane, ear canal and external ear normal.     Left Ear:  Tympanic membrane, ear canal and external ear normal.  Eyes:     Extraocular Movements: Extraocular movements intact.     Right eye: No nystagmus.     Left eye: No nystagmus.     Conjunctiva/sclera: Conjunctivae normal.     Pupils: Pupils are equal, round, and reactive to light.  Cardiovascular:     Rate and Rhythm: Normal rate and regular rhythm.     Pulses:          Radial pulses are 2+ on the right side and 2+ on the left side.  Pulmonary:     Effort: Pulmonary effort is normal. No respiratory distress.     Breath sounds: Normal breath sounds. No wheezing or rhonchi.  Musculoskeletal:     Cervical back: Neck supple.     Right lower leg: No edema.     Left lower leg: No edema.  Lymphadenopathy:     Cervical: No cervical adenopathy.  Skin:    General: Skin is warm and dry.  Neurological:     Mental Status: She is alert and oriented to person, place, and time.     Cranial Nerves: Cranial nerves 2-12 are intact. No facial asymmetry.  Motor: Motor function is intact. No weakness, tremor, abnormal muscle tone or pronator drift.     Coordination: Romberg sign negative. Coordination normal. Finger-Nose-Finger Test and Heel to [facility] Test normal.     Gait: Gait is intact. Gait normal.     Deep Tendon Reflexes:     Reflex Scores:      Patellar reflexes are 2+ on the right side and 2+ on the left side. Psychiatric:        Attention and Perception: Attention normal.        Mood and Affect: Mood normal.        Speech: Speech normal.        Behavior: Behavior normal. Behavior is cooperative.     Comments: Pleasant and engaged           Assessment & Plan:   Encounter Diagnoses  Name Primary?   Nonintractable headache, unspecified chronicity pattern, unspecified headache type Yes   Follow-up for resolved condition      -pt was recommended to avoid blueberries -pt to RTO if she continues to have issues.  Otherwise she will follow up as scheduled later this year for her annual  well visit

## 2023-04-06 ENCOUNTER — Telehealth: Payer: Self-pay

## 2023-04-06 NOTE — Telephone Encounter (Signed)
Attempted wellness follow up. No answer, left vm requesting return call.   Francee Nodal RN [facility]

## 2023-04-28 ENCOUNTER — Encounter: Payer: Self-pay | Admitting: Physician Assistant

## 2023-04-28 ENCOUNTER — Ambulatory Visit: Payer: Self-pay | Admitting: Physician Assistant

## 2023-04-28 VITALS — BP 126/87 | HR 60 | Temp 97.4°F | Wt 167.0 lb

## 2023-04-28 DIAGNOSIS — Z0289 Encounter for other administrative examinations: Secondary | ICD-10-CM

## 2023-04-28 DIAGNOSIS — Z0000 Encounter for general adult medical examination without abnormal findings: Secondary | ICD-10-CM

## 2023-04-28 DIAGNOSIS — H6123 Impacted cerumen, bilateral: Secondary | ICD-10-CM

## 2023-04-28 NOTE — Progress Notes (Signed)
BP 126/87   Pulse 60   Temp (!) 97.4 F (36.3 C)   Wt 167 lb (75.8 kg)   SpO2 99%   BMI 27.79 kg/m    Subjective:    Patient ID: Madeline Sanders, female    DOB: 09-29-1985, 37 y.o.   MRN: [id number]  HPI: Madeline Sanders is a 36 y.o. female presenting on 04/28/2023 for Annual Exam   HPI  Pt is 36yoF who presents for annual wellness exam and to have a form completed for work.    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  She says she is exercising regularly.    She does BSE regularly.  She says she has no complaints and denies cp, sob,  abdominal pain, depression or anxiety.   She sometimes feels like water in her left ear.  She uses qtip in the ear and sometimes a key.  She uses ear buds.  She does not report pain or changes with hearing.   Relevant past medical, surgical, family and social history reviewed and updated as indicated. Interim medical history since our last visit reviewed. Allergies and medications reviewed and updated.  No current outpatient medications on file.    Review of Systems  Per HPI unless specifically indicated above     Objective:    BP 126/87   Pulse 60   Temp (!) 97.4 F (36.3 C)   Wt 167 lb (75.8 kg)   SpO2 99%   BMI 27.79 kg/m   Wt Readings from Last 3 Encounters:  04/28/23 167 lb (75.8 kg)  09/01/22 162 lb (73.5 kg)  05/17/22 155 lb (70.3 kg)    VISUAL ACUITY:  With glasses: OD 20/15 OS 20/15 OU 20/10  Without glasses: OU 20/200    Physical Exam Vitals reviewed.  Constitutional:      General: She is not in acute distress.    Appearance: She is well-developed. She is not toxic-appearing.  HENT:     Head: Normocephalic and atraumatic.     Right Ear: Tympanic membrane, ear canal and external ear normal. There is impacted cerumen.     Left Ear: Tympanic membrane, ear canal and external ear normal. There is impacted cerumen.     Ears:     Comments: Cerumen B.  After  lavage, TMs normal    Mouth/Throat:     Mouth: Oropharynx is clear and moist.  Eyes:     Extraocular Movements: Extraocular movements intact and EOM normal.     Conjunctiva/sclera: Conjunctivae normal.     Pupils: Pupils are equal, round, and reactive to light.  Neck:     Thyroid: No thyromegaly.  Cardiovascular:     Rate and Rhythm: Normal rate and regular rhythm.     Pulses: Normal pulses.     Heart sounds: Normal heart sounds. No murmur heard. Pulmonary:     Effort: Pulmonary effort is normal.     Breath sounds: Normal breath sounds.  Abdominal:     General: Bowel sounds are normal.     Palpations: Abdomen is soft. There is no mass.     Tenderness: There is no abdominal tenderness.  Musculoskeletal:        General: No edema.     Right shoulder: Normal range of motion.     Left shoulder: Normal range of motion.     Cervical back: Normal and neck supple.     Thoracic back: Normal.  Lumbar back: Normal.     Right hip: Normal range of motion.     Left hip: Normal range of motion.     Right knee: Normal range of motion.     Left knee: Normal range of motion.     Right lower leg: No edema.     Left lower leg: No edema.  Lymphadenopathy:     Cervical: No cervical adenopathy.  Skin:    General: Skin is warm and dry.  Neurological:     Mental Status: She is alert and oriented to person, place, and time.     Motor: Motor function is intact. No weakness or tremor.     Coordination: Coordination is intact. Romberg sign negative. Finger-Nose-Finger Test normal.     Gait: Gait is intact. Gait normal.     Deep Tendon Reflexes:     Reflex Scores:      Patellar reflexes are 2+ on the right side and 2+ on the left side. Psychiatric:        Mood and Affect: Mood and affect normal.        Behavior: Behavior normal.           Assessment & Plan:    Encounter Diagnoses  Name Primary?   Encounter for annual health examination Yes   Bilateral impacted cerumen    Encounter  for other administrative examinations      PE portion of her form completed.  She will RTO Monday morning for placement of PPD and RTO Wednesday for reading of PPD and to update PAP.

## 2023-05-02 ENCOUNTER — Ambulatory Visit: Payer: Self-pay | Admitting: Physician Assistant

## 2023-05-02 DIAGNOSIS — Z111 Encounter for screening for respiratory tuberculosis: Secondary | ICD-10-CM

## 2023-05-02 NOTE — Progress Notes (Unsigned)
   There were no vitals taken for this visit.   Subjective:    Patient ID: Madeline Sanders, female    DOB: Aug 18, 1985, 37 y.o.   MRN: [id number]  HPI: Madeline Sanders is a 37 y.o. female presenting on 05/02/2023 for No chief complaint on file.   HPI   Pt is in today for placement of PPD.  She was in last week for work physical that required PPD.    She feels well today.      Objective:    There were no vitals taken for this visit.  Wt Readings from Last 3 Encounters:  04/28/23 167 lb (75.8 kg)  09/01/22 162 lb (73.5 kg)  05/17/22 155 lb (70.3 kg)    Physical Exam Constitutional:      General: She is not in acute distress.    Appearance: She is not toxic-appearing.  HENT:     Head: Normocephalic and atraumatic.  Pulmonary:     Effort: Pulmonary effort is normal. No respiratory distress.  Neurological:     Mental Status: She is alert and oriented to person, place, and time.  Psychiatric:        Behavior: Behavior normal.     PPD test place left forearm by Noemi Chapel, PA-C      Assessment & Plan:    Encounter Diagnosis  Name Primary?   Encounter for PPD test Yes      Pt to RTO on Wednesday for PPD reading and to update PAP

## 2023-05-04 ENCOUNTER — Other Ambulatory Visit (HOSPITAL_COMMUNITY)
Admission: RE | Admit: 2023-05-04 | Discharge: 2023-05-04 | Disposition: A | Discharge status: Home / Self Care | Payer: Self-pay | Source: Ambulatory Visit | Attending: Physician Assistant | Admitting: Physician Assistant

## 2023-05-04 ENCOUNTER — Encounter: Payer: Self-pay | Admitting: Physician Assistant

## 2023-05-04 ENCOUNTER — Ambulatory Visit: Payer: Self-pay | Admitting: Physician Assistant

## 2023-05-04 VITALS — BP 116/74 | HR 65 | Temp 97.9°F | Wt 170.0 lb

## 2023-05-04 DIAGNOSIS — Z01419 Encounter for gynecological examination (general) (routine) without abnormal findings: Secondary | ICD-10-CM

## 2023-05-04 DIAGNOSIS — Z111 Encounter for screening for respiratory tuberculosis: Secondary | ICD-10-CM

## 2023-05-04 LAB — TB SKIN TEST
Induration: 0 mm
TB Skin Test: NEGATIVE

## 2023-05-04 NOTE — Progress Notes (Signed)
   BP 116/74   Pulse 65   Temp 97.9 F (36.6 C)   Wt 170 lb (77.1 kg)   SpO2 100%   BMI 28.29 kg/m    Subjective:    Patient ID: Madeline Sanders, female    DOB: 05/25/1986, 37 y.o.   MRN: [id number]  HPI: Madeline Sanders is a 37 y.o. female presenting on 05/04/2023 for Gynecologic Exam   HPI  Pt is 37yoF in today to have her ppd test read and update PAP.  She is feeling well today.  Relevant past medical, surgical, family and social history reviewed and updated as indicated. Interim medical history since our last visit reviewed. Allergies and medications reviewed and updated.  Review of Systems  Per HPI unless specifically indicated above     Objective:    BP 116/74   Pulse 65   Temp 97.9 F (36.6 C)   Wt 170 lb (77.1 kg)   SpO2 100%   BMI 28.29 kg/m   Wt Readings from Last 3 Encounters:  05/04/23 170 lb (77.1 kg)  04/28/23 167 lb (75.8 kg)  09/01/22 162 lb (73.5 kg)    Physical Exam Vitals and nursing note reviewed. Exam conducted with a chaperone present.  Constitutional:      General: She is not in acute distress.    Appearance: She is well-developed. She is not toxic-appearing.  HENT:     Head: Normocephalic and atraumatic.  Pulmonary:     Effort: Pulmonary effort is normal. No respiratory distress.  Chest:  Breasts:    Right: Normal.     Left: Normal.  Abdominal:     Palpations: Abdomen is soft. There is no mass.     Tenderness: There is no abdominal tenderness. There is no guarding or rebound.  Genitourinary:    Labia:        Right: No rash, tenderness or lesion.        Left: No rash, tenderness or lesion.      Vagina: Normal.     Cervix: No cervical motion tenderness, discharge or friability.     Adnexa:        Right: No mass, tenderness or fullness.         Left: No mass, tenderness or fullness.       Comments: (Nurse Berenice assisted) Skin:    General: Skin is warm and dry.     Comments: PPD site left forearm read as 0mm    Neurological:     Mental Status: She is alert and oriented to person, place, and time.  Psychiatric:        Behavior: Behavior normal.           Assessment & Plan:    Encounter Diagnoses  Name Primary?   Encounter for routine gynecological examination with Papanicolaou smear of cervix Yes   Encounter for PPD test      -pt is given her work form -pt encouraged to continue BSE -pt to follow up 1 year for regular annual exam.  She is to contact office sooner prn

## 2023-05-09 LAB — CYTOLOGY - PAP
Comment: NEGATIVE
Diagnosis: NEGATIVE
High risk HPV: NEGATIVE

## 2023-05-16 ENCOUNTER — Ambulatory Visit: Payer: Self-pay | Admitting: Physician Assistant

## 2024-04-05 ENCOUNTER — Telehealth: Payer: Self-pay

## 2024-04-05 NOTE — Telephone Encounter (Signed)
 Returned call about re-enrolling into [facility], no answer, Left voicemail requesting return call.   Avelina JONELLE Skeen RN [facility]

## 2024-04-10 ENCOUNTER — Ambulatory Visit: Payer: Self-pay | Admitting: Physician Assistant

## 2024-05-01 ENCOUNTER — Ambulatory Visit: Payer: Self-pay | Admitting: Physician Assistant
